# Patient Record
Sex: Male | Born: 1980 | Race: White | Hispanic: No | Marital: Single | Smoking: Current every day smoker
Health system: Southern US, Community
[De-identification: ages and names within clinical notes are randomized; demographics above are authoritative.]

## PROBLEM LIST (undated history)

## (undated) ENCOUNTER — Emergency Department (HOSPITAL_COMMUNITY): Disposition: A | Discharge status: Home / Self Care | Payer: Self-pay

## (undated) DIAGNOSIS — F1911 Other psychoactive substance abuse, in remission: Secondary | ICD-10-CM

## (undated) DIAGNOSIS — K219 Gastro-esophageal reflux disease without esophagitis: Secondary | ICD-10-CM

## (undated) DIAGNOSIS — S02609A Fracture of mandible, unspecified, initial encounter for closed fracture: Secondary | ICD-10-CM

## (undated) DIAGNOSIS — B1920 Unspecified viral hepatitis C without hepatic coma: Secondary | ICD-10-CM

## (undated) DIAGNOSIS — K08109 Complete loss of teeth, unspecified cause, unspecified class: Secondary | ICD-10-CM

## (undated) HISTORY — PX: HIP SURGERY: SHX245

---

## 2003-07-18 ENCOUNTER — Emergency Department (HOSPITAL_COMMUNITY): Admission: EM | Admit: 2003-07-18 | Discharge: 2003-07-19 | Payer: Self-pay | Admitting: Emergency Medicine

## 2008-12-15 ENCOUNTER — Emergency Department (HOSPITAL_BASED_OUTPATIENT_CLINIC_OR_DEPARTMENT_OTHER): Admission: EM | Admit: 2008-12-15 | Discharge: 2008-12-15 | Payer: Self-pay | Admitting: Emergency Medicine

## 2008-12-15 ENCOUNTER — Ambulatory Visit: Payer: Self-pay | Admitting: Diagnostic Radiology

## 2009-01-24 ENCOUNTER — Ambulatory Visit: Payer: Self-pay | Admitting: Diagnostic Radiology

## 2009-01-24 ENCOUNTER — Emergency Department (HOSPITAL_BASED_OUTPATIENT_CLINIC_OR_DEPARTMENT_OTHER): Admission: EM | Admit: 2009-01-24 | Discharge: 2009-01-24 | Payer: Self-pay | Admitting: Emergency Medicine

## 2009-05-02 ENCOUNTER — Emergency Department (HOSPITAL_BASED_OUTPATIENT_CLINIC_OR_DEPARTMENT_OTHER): Admission: EM | Admit: 2009-05-02 | Discharge: 2009-05-02 | Payer: Self-pay | Admitting: Emergency Medicine

## 2011-01-10 LAB — URINALYSIS, ROUTINE W REFLEX MICROSCOPIC
Bilirubin Urine: NEGATIVE
Glucose, UA: NEGATIVE mg/dL
Ketones, ur: NEGATIVE mg/dL
Protein, ur: NEGATIVE mg/dL
pH: 6.5 (ref 5.0–8.0)

## 2011-01-10 LAB — POCT TOXICOLOGY PANEL: Benzodiazepines: POSITIVE

## 2012-03-20 ENCOUNTER — Emergency Department (HOSPITAL_COMMUNITY)
Admission: EM | Admit: 2012-03-20 | Discharge: 2012-03-22 | Disposition: A | Discharge status: Other Acute Inpatient Hospital | Payer: Self-pay | Attending: Emergency Medicine | Admitting: Emergency Medicine

## 2012-03-20 ENCOUNTER — Encounter (HOSPITAL_COMMUNITY): Payer: Self-pay | Admitting: *Deleted

## 2012-03-20 DIAGNOSIS — B1920 Unspecified viral hepatitis C without hepatic coma: Secondary | ICD-10-CM | POA: Insufficient documentation

## 2012-03-20 DIAGNOSIS — F1910 Other psychoactive substance abuse, uncomplicated: Secondary | ICD-10-CM | POA: Insufficient documentation

## 2012-03-20 HISTORY — DX: Unspecified viral hepatitis C without hepatic coma: B19.20
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
## 2012-03-20 NOTE — ED Provider Notes (Signed)
History     CSN: [id number]  Arrival date & time 03/20/12  2049   First MD Initiated Contact with Patient 03/20/12 2301      Chief Complaint  Patient presents with  . Withdrawal    Pt wants to "get off drugs"    (Consider location/radiation/quality/duration/timing/severity/associated sxs/prior treatment) HPI Pt presenting with c/o wanting to stop using heroin and benzos.  He states he last used heroin tonight- also took 2 klonopin earlier [employer].  Pt denies feeeling suicidal or homicidal.  He states " i don't want to die, that's why I'm here".  Denies any recent illness, no fevers cough or vomiting.  Feels sleepy right now.  There are no other alleviating or modifying factors, there are no other associated systemic symptoms  Past Medical History  Diagnosis Date  . Hepatitis C     History reviewed. No pertinent past surgical history.  No family history on file.  History  Substance Use Topics  . Smoking status: Not on file  . Smokeless tobacco: Not on file  . Alcohol Use:       Review of Systems ROS reviewed and all otherwise negative except for mentioned in HPI  Allergies  Ivp dye  Home Medications  No current outpatient prescriptions on file.  BP 116/74  Pulse 67  Temp 96.7 F (35.9 C) (Oral)  Resp 10  SpO2 98% Vitals reviewed Physical Exam Physical Examination: General appearance - sleeping but easily arousable to voice, well appearing, and in no distress Mental status - alert, oriented to person, place, and time Eyes - pupils equal and reactive approx 1mm, no scleral icterus Mouth - mucous membranes moist, pharynx normal without lesions Chest - clear to auscultation, no wheezes, rales or rhonchi, symmetric air entry Heart - normal rate, regular rhythm, normal S1, S2, no murmurs, rubs, clicks or gallops Abdomen - soft, nontender, nondistended, no masses or organomegaly Extremities - peripheral pulses normal, no pedal edema, no clubbing or cyanosis Skin  - normal coloration and turgor, no rashes Psych- flat affect, cooperative  ED Course  Procedures (including critical care time)  12:33 AM calling to inquire about labs- state collected, but have been awaiting them for quite some time.   2:19 AM  D/w ACT team they will see patient for evaluation  Labs Reviewed  URINE RAPID DRUG SCREEN (HOSP PERFORMED) - Abnormal; Notable for the following:    Opiates POSITIVE (*)     Cocaine POSITIVE (*)     Benzodiazepines POSITIVE (*)     Tetrahydrocannabinol POSITIVE (*)     All other components within normal limits  COMPREHENSIVE METABOLIC PANEL - Abnormal; Notable for the following:    Glucose, Bld 128 (*)     All other components within normal limits  ETHANOL  CBC  DIFFERENTIAL   No results found.   1. Substance abuse       MDM  Patient presenting requesting detox from heroin. He also endorses them so use. His urine drug screen was positive in addition to these substances for cocaine and marijuana. He denies any suicidal ideations. Patient is currently awaiting disposition based on act team evaluation.  Psych holding orders written.        Ethelda Chick, MD 03/21/12 [phone]

## 2012-03-21 LAB — CBC
HCT: 41 % (ref 39.0–52.0)
Hemoglobin: 13.6 g/dL (ref 13.0–17.0)
RDW: 12.9 % (ref 11.5–15.5)
WBC: 7.3 10*3/uL (ref 4.0–10.5)

## 2012-03-21 LAB — ETHANOL: Alcohol, Ethyl (B): <11 mg/dL (ref 0–11)

## 2012-03-21 LAB — COMPREHENSIVE METABOLIC PANEL
Albumin: 3.6 g/dL (ref 3.5–5.2)
BUN: 13 mg/dL (ref 6–23)
Creatinine, Ser: 0.8 mg/dL (ref 0.50–1.35)
GFR calc Af Amer: >90 mL/min (ref >90)
Total Protein: 6.4 g/dL (ref 6.0–8.3)

## 2012-03-21 LAB — DIFFERENTIAL
Basophils Absolute: 0 10*3/uL (ref 0.0–0.1)
Lymphocytes Relative: 38 % (ref 12–46)
Monocytes Absolute: 0.7 10*3/uL (ref 0.1–1.0)
Neutro Abs: 3.6 10*3/uL (ref 1.7–7.7)
Neutrophils Relative %: 49 % (ref 43–77)

## 2012-03-21 LAB — RAPID URINE DRUG SCREEN, HOSP PERFORMED
Amphetamines: NOT DETECTED
Benzodiazepines: POSITIVE — AB
Cocaine: POSITIVE — AB
Opiates: POSITIVE — AB
Tetrahydrocannabinol: POSITIVE — AB

## 2012-03-21 MED ORDER — NICOTINE 21 MG/24HR TD PT24
21.0000 mg | MEDICATED_PATCH | Freq: Every day | TRANSDERMAL | Status: DC
  Administered 2012-03-22: 21 mg via TRANSDERMAL
  Filled 2012-03-21 (×2): qty 1

## 2012-03-21 MED ORDER — IBUPROFEN 400 MG PO TABS: 600.0000 mg | ORAL_TABLET | Freq: Three times a day (TID) | ORAL | Status: DC | PRN

## 2012-03-21 MED ORDER — ONDANSETRON HCL 8 MG PO TABS
4.0000 mg | ORAL_TABLET | Freq: Three times a day (TID) | ORAL | Status: DC | PRN
  Administered 2012-03-22: 4 mg via ORAL
  Filled 2012-03-21: qty 2
  Filled 2012-03-21: qty 1

## 2012-03-21 MED ORDER — ACETAMINOPHEN 325 MG PO TABS
650.0000 mg | ORAL_TABLET | ORAL | Status: DC | PRN
  Administered 2012-03-21: 650 mg via ORAL
  Filled 2012-03-21: qty 2

## 2012-03-21 NOTE — ED Notes (Signed)
Marchelle Folks, ACT Team Counselor present at patient's bedside.
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
## 2012-03-21 NOTE — ED Notes (Signed)
Patient awaken and states his back hurts and he wants to sleep. Lunch tray given to patient.

## 2012-03-21 NOTE — ED Notes (Signed)
Pt awake at this time c/o lower back pain and requesting Tylenol.  Dr. Weldon Inches made aware.

## 2012-03-21 NOTE — BH Assessment (Signed)
BHH Assessment Progress Note      Attempted to assess patient who continues to request detox, but was still "out of it".  Pt would attempt to answer questions, but fall asleep after every question and require constant stimulation to wake up.  Pt finally stated, "I'm really out of it."  I explained he would have to be reassessed when he could participate in the assessment.  He was concerned that he would be kicked out, but agreed that he could not participate at this time.  RN notified to notify ACT team when patient is alert and cooperative.

## 2012-03-21 NOTE — ED Notes (Signed)
Pt resting with eyes closed.  Pt opened eyes when I entered the room then closed them back again.  Lunch tray removed from room pt did not eat anything from tray.  Dinner tray ordered for pt.

## 2012-03-21 NOTE — BH Assessment (Signed)
BHH Assessment Progress Note      Clinician attempted to assess patient for placement in inpatient detox.  Pt woke up and looked at clinician, but immediately closed his eyes and laid back down.  Clinician attempted to wake patient 3 times, but was unable to wake him enough to complete the assessment.  Notified RN.  Will continue to follow.

## 2012-03-21 NOTE — ED Notes (Addendum)
First meeting with patient. Patient sleeping and remains on monitor and sats of 98% RA.

## 2012-03-21 NOTE — ED Notes (Signed)
Pt ate part of his dinner and then went back to sleep.  Pt c/o back pain, bed repositioned.

## 2012-03-22 MED ORDER — LORAZEPAM 1 MG PO TABS
2.0000 mg | ORAL_TABLET | Freq: Once | ORAL | Status: AC
  Administered 2012-03-22: 2 mg via ORAL
  Filled 2012-03-22: qty 2

## 2012-03-22 NOTE — ED Notes (Signed)
Lunch tray ordered 

## 2012-03-22 NOTE — ED Notes (Signed)
Pt resting no needs at this time.

## 2012-03-22 NOTE — ED Notes (Signed)
Lunch tray arrived.   

## 2012-03-22 NOTE — Discharge Instructions (Signed)
Go to [facility] where they expect you.

## 2012-03-22 NOTE — ED Notes (Signed)
Pt resting at this time.

## 2012-03-22 NOTE — ED Notes (Signed)
Pt resting, no needs at this time.

## 2012-03-22 NOTE — ED Notes (Signed)
Patient experiencing nausea with dry heaves.

## 2012-03-22 NOTE — BH Assessment (Signed)
Assessment Note   Caleb Bush is an 31 y.o. male. Pt presents in the Ed due to heavy SA use.  Pt. Drug of choice is heroin. Pt. Reports using at least 3-4 grams per day.  Pt. Also reports he uses opiates, pt reports he "takes as many as he can".  Pt. Was incarcerated for 2 1/2 years and was released on or about March of this year.  Pt. Reports he maintained his sobriety while in prison and upon release began using immediately.  Pt. Reports he is "tired of using and wants his family to love him".  Pt. Reports he was staying with mother, who is also in recovery, drug of choice unknown, but she put him out due to his reckless behaviors and drug use.  Pt. Has pending criminal charges with court dates on July 29 and Aug, 19 for DUI.  Pt. Was tearful, agitated and having hot/cold flashes during interview.  Pt. Has been in ED since June 21 and was unable to assess because staff could not wake him.  Pt. Reports "he is very tired and just want to sleep".  Pt. Is seeking detox and residential treatment and reports "he just want to be clean and not use drugs anymore". Pt. Has insight into his drug addiction and seems to want sobriety.  Pt. Has no SI, no HI, no A/V hallucinations and no hx of mental health or substance abuse treatment.  Pt. Referred to [facility] for detox.  Pt. Will need transportation as he denies having a support system.    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 Hepatitis C     History reviewed. No pertinent past surgical history.  Family History: No family history on file.  Social History:  does not have a smoking history on file. He does not have any smokeless tobacco history on file. His alcohol and drug histories not on file.  Additional Social History:  Alcohol / Drug Use History of  alcohol / drug use?: Yes Substance #1 Name of Substance 1: Heroin 1 - Age of First Use: 23 1 - Amount (size/oz): 3 grams 1 - Frequency: daily 1 - Duration: 2years 1 - Last Use / Amount: 03/20/12  3 grams Substance #2 Name of Substance 2: Xanax 2 - Frequency: daily  CIWA: CIWA-Ar BP: 124/78 mmHg Pulse Rate: 89  COWS:    Allergies:  Allergies  Allergen Reactions  . Ivp Dye (Iodinated Diagnostic Agents)     Unknown    Home Medications:  (Not in a hospital admission)  OB/GYN Status:  No LMP for male patient.  General Assessment Data Location of Assessment: [facility] ED ACT Assessment: Yes Living Arrangements: Other (Comment) Can pt return to current living arrangement?: No Admission Status: Voluntary Is patient capable of signing voluntary admission?: Yes Transfer from: Home Referral Source: MD  Education Status Is patient currently in school?: No  Risk to self Suicidal Ideation: No Suicidal Intent: No Is patient at risk for suicide?: No Suicidal Plan?: No Access to Means: No Specify Access to Suicidal Means: denies What has been your use of drugs/alcohol within the last 12 months?: daily Previous Attempts/Gestures: No How many times?: 0  Other Self Harm Risks: drug use, reckless driving Triggers for Past Attempts: Unknown Intentional Self Injurious Behavior: Damaging Comment -  Self Injurious Behavior: ETOH driving, drug use Family Suicide History: No Recent stressful life event(s): Conflict (Comment) Persecutory voices/beliefs?: No Depression: Yes Depression Symptoms: Despondent;Tearfulness;Fatigue;Loss of interest in usual pleasures;Feeling worthless/self pity;Guilt Substance abuse history and/or treatment for substance abuse?: Yes Suicide prevention information given to non-admitted patients: Not applicable  Risk to Others Homicidal Ideation: No Thoughts of Harm to Others: No Current Homicidal Intent: No Current Homicidal Plan: No Access to Homicidal Means:  No Identified Victim: denies History of harm to others?: No Assessment of Violence: None Noted Violent Behavior Description: denies Does patient have access to weapons?: No Criminal Charges Pending?: Yes Describe Pending Criminal Charges: DUI, reckless driving Does patient have a court date: Yes Court Date: 04/27/12  Psychosis Hallucinations: None noted Delusions: None noted  Mental Status Report Appear/Hygiene: Disheveled Eye Contact: Fair Motor Activity: Freedom of movement Speech: Logical/coherent Level of Consciousness: Alert Mood: Depressed Affect: Appropriate to circumstance Anxiety Level: Minimal Thought Processes: Coherent;Relevant Judgement: Unimpaired Orientation: Person;Place;Situation Obsessive Compulsive Thoughts/Behaviors: None  Cognitive Functioning Concentration: Normal Memory: Recent Intact;Remote Intact IQ: Average Insight: Poor Impulse Control: Poor Appetite: Poor Weight Loss: 0  Weight Gain: 0  Sleep: Decreased Total Hours of Sleep: 1  Vegetative Symptoms: Decreased grooming  ADLScreening [facility]) Patient's cognitive ability adequate to safely complete daily activities?: Yes Patient able to express need for assistance with ADLs?: Yes Independently performs ADLs?: Yes  Abuse/Neglect [facility]) Physical Abuse: Denies Verbal Abuse: Denies Sexual Abuse: Denies  Prior Inpatient Therapy Prior Inpatient Therapy: No  Prior Outpatient Therapy Prior Outpatient Therapy: No  ADL Screening (condition at time of admission) Patient's cognitive ability adequate to safely complete daily activities?: Yes Patient able to express need for assistance with ADLs?: Yes Independently performs ADLs?: Yes       Abuse/Neglect Assessment (Assessment to be complete while patient is alone) Physical Abuse: Denies Verbal Abuse: Denies Sexual Abuse: Denies Values / Beliefs Cultural Requests During Hospitalization: None Spiritual Requests During  Hospitalization: None              Disposition: Pt. Referred to [facility], pending disposition.  Spoke to [place] she will review pt. For admission and contact ACT if any additional information is needed.  Disposition Disposition of Patient: Inpatient treatment program;Referred to Type of inpatient treatment program: Adult Patient referred to: [facility]  On Site Evaluation by:   Reviewed with Physician:     Barbaraann Boys 03/22/2012 1:20 PM

## 2012-03-22 NOTE — ED Notes (Signed)
Pt placed back on monitor, continuous pulse oximetry and blood pressure cuff; pt now resting and has no needs at this time

## 2012-03-22 NOTE — ED Notes (Signed)
Pt asleep no needs at this time

## 2012-03-22 NOTE — ED Notes (Signed)
Pt in room sitting on the end of stretcher talking on phone

## 2012-03-22 NOTE — ED Notes (Signed)
Report given to [place], NT

## 2012-03-22 NOTE — ED Provider Notes (Signed)
Pt has been accepted at [facility], MD 03/22/12 1945

## 2012-03-22 NOTE — ED Notes (Signed)
Pt inquiring about his status; informed RN and made pt aware that ACT Team was coming to speak with him about detox; pt acknowledged and understood; pt resting now

## 2012-03-22 NOTE — BHH Counselor (Signed)
Counselor returned phone call to [facility] spoke with Baird Lyons to complete screening; provided information in reference to drug history, insurance, military history, withdrawal and allergies; and transportation.  Baird Lyons with [facility] stated that she would pass the information onto the scheduling nurse and states that they would call ACT back with more information.  Spoke with pt would stated that he was experiencing extreme agitation and that if he could not get placed he would be leaving; cm explained to pt that ACT was attempting to get him placed as soon as possible and that cm would speak with his nurse in regards to his agitation; pt states that if he does not get placed today that he would be leaving because his mother has found a [place] based 90 day recovery program that he can enter into on tomorrow 03/23/12; Cm again explained to pt that ACT was working hard to get him placed and that I had just gotten off the phone with [facility] in regards to his placement; cm explained to pt that she would keep him updated.Marland Kitchen

## 2012-03-22 NOTE — ED Notes (Signed)
Pt sleeping w/ tv on and lights off.

## 2013-04-16 ENCOUNTER — Emergency Department (HOSPITAL_BASED_OUTPATIENT_CLINIC_OR_DEPARTMENT_OTHER)
Admission: EM | Admit: 2013-04-16 | Discharge: 2013-04-16 | Disposition: A | Discharge status: Home / Self Care | Payer: Self-pay | Attending: Emergency Medicine | Admitting: Emergency Medicine

## 2013-04-16 ENCOUNTER — Encounter (HOSPITAL_BASED_OUTPATIENT_CLINIC_OR_DEPARTMENT_OTHER): Payer: Self-pay

## 2013-04-16 DIAGNOSIS — R109 Unspecified abdominal pain: Secondary | ICD-10-CM | POA: Insufficient documentation

## 2013-04-16 DIAGNOSIS — R6883 Chills (without fever): Secondary | ICD-10-CM | POA: Insufficient documentation

## 2013-04-16 DIAGNOSIS — Z8619 Personal history of other infectious and parasitic diseases: Secondary | ICD-10-CM | POA: Insufficient documentation

## 2013-04-16 DIAGNOSIS — F411 Generalized anxiety disorder: Secondary | ICD-10-CM | POA: Insufficient documentation

## 2013-04-16 DIAGNOSIS — F1110 Opioid abuse, uncomplicated: Secondary | ICD-10-CM | POA: Insufficient documentation

## 2013-04-16 DIAGNOSIS — R110 Nausea: Secondary | ICD-10-CM | POA: Insufficient documentation

## 2013-04-16 DIAGNOSIS — F19939 Other psychoactive substance use, unspecified with withdrawal, unspecified: Secondary | ICD-10-CM | POA: Insufficient documentation

## 2013-04-16 DIAGNOSIS — F1123 Opioid dependence with withdrawal: Secondary | ICD-10-CM

## 2013-04-16 MED ORDER — CLONIDINE HCL 0.2 MG PO TABS: 0.2000 mg | ORAL_TABLET | Freq: Two times a day (BID) | ORAL | Status: DC

## 2013-04-16 NOTE — ED Notes (Signed)
MD at bedside. 

## 2013-04-16 NOTE — ED Provider Notes (Signed)
   History    CSN: [id number] Arrival date & time 04/16/13  1020  None    Chief Complaint  Patient presents with  . Hypertension  . Abdominal Pain   (Consider location/radiation/quality/duration/timing/severity/associated sxs/prior Treatment) HPI Comments: Patient presents to the ER for evaluation of elevated blood pressure. Patient currently inpatient for heroin abuse. Has not used in 2 days. Today's blood pressure was elevated, 143/101. Patient reports mild nausea, no vomiting or diarrhea. He has had some chills and cramping of the abdomen. No headache, blurred vision, chest pain.  Patient is a 32 y.o. male presenting with hypertension and abdominal pain.  Hypertension Associated symptoms include abdominal pain.  Abdominal Pain Associated symptoms include abdominal pain.   Past Medical History  Diagnosis Date  . Hepatitis C    No past surgical history on file. No family history on file. History  Substance Use Topics  . Smoking status: Not on file  . Smokeless tobacco: Not on file  . Alcohol Use:     Review of Systems  Constitutional: Positive for chills.  Gastrointestinal: Positive for nausea and abdominal pain.  Psychiatric/Behavioral: The patient is nervous/anxious.   All other systems reviewed and are negative.    Allergies  Ivp dye  Home Medications  No current outpatient prescriptions on file. There were no vitals taken for this visit. Physical Exam  Constitutional: He is oriented to person, place, and time. He appears well-developed and well-nourished. No distress.  HENT:  Head: Normocephalic and atraumatic.  Right Ear: Hearing normal.  Left Ear: Hearing normal.  Nose: Nose normal.  Mouth/Throat: Oropharynx is clear and moist and mucous membranes are normal.  Eyes: Conjunctivae and EOM are normal. Pupils are equal, round, and reactive to light.  Neck: Normal range of motion. Neck supple.  Cardiovascular: Regular rhythm, S1 normal and S2 normal.  Exam  reveals no gallop and no friction rub.   No murmur heard. Pulmonary/Chest: Effort normal and breath sounds normal. No respiratory distress. He exhibits no tenderness.  Abdominal: Soft. Normal appearance and bowel sounds are normal. There is no hepatosplenomegaly. There is no tenderness. There is no rebound, no guarding, no tenderness at McBurney's point and negative Murphy's sign. No hernia.  Musculoskeletal: Normal range of motion.  Neurological: He is alert and oriented to person, place, and time. He has normal strength. No cranial nerve deficit or sensory deficit. Coordination normal. GCS eye subscore is 4. GCS verbal subscore is 5. GCS motor subscore is 6.  Skin: Skin is warm, dry and intact. No rash noted. No cyanosis.  Psychiatric: He has a normal mood and affect. His speech is normal and behavior is normal. Thought content normal.    ED Course  Procedures (including critical care time) Labs Reviewed - No data to display No results found.  Diagnosis: Heroin withdrawal; hypertension  MDM  Patient presents to the ER for evaluation of mild blood pressure elevation. He reports that in the past he has had mild elevation of blood pressures, but never been treated. Patient is currently day 3 after taking it for heroin addiction. Patient's symptoms are all consistent with mild withdrawal. Patient to be treated with clonidine for symptomatic treatment of withdrawal as well as his blood pressure.  Gilda Crease, MD 04/16/13 1038

## 2013-04-16 NOTE — ED Notes (Signed)
Sent to ED for further evaluation of elevated BP.  Pt resides at [facility] and states BP was 143/101 PTA.  He also reports not feeling well, abdominal pain and chills.

## 2013-04-24 ENCOUNTER — Emergency Department (HOSPITAL_BASED_OUTPATIENT_CLINIC_OR_DEPARTMENT_OTHER)
Admission: EM | Admit: 2013-04-24 | Discharge: 2013-04-24 | Disposition: A | Discharge status: Home / Self Care | Payer: Self-pay | Attending: Emergency Medicine | Admitting: Emergency Medicine

## 2013-04-24 ENCOUNTER — Encounter (HOSPITAL_BASED_OUTPATIENT_CLINIC_OR_DEPARTMENT_OTHER): Payer: Self-pay | Admitting: Emergency Medicine

## 2013-04-24 DIAGNOSIS — R21 Rash and other nonspecific skin eruption: Secondary | ICD-10-CM | POA: Insufficient documentation

## 2013-04-24 DIAGNOSIS — F17200 Nicotine dependence, unspecified, uncomplicated: Secondary | ICD-10-CM | POA: Insufficient documentation

## 2013-04-24 DIAGNOSIS — Z8619 Personal history of other infectious and parasitic diseases: Secondary | ICD-10-CM | POA: Insufficient documentation

## 2013-04-24 NOTE — ED Notes (Signed)
Pt sts at [facility] and noticed rash to chest, thinks it may be from detergent. Pt sts they made hime come get checked out.

## 2013-04-24 NOTE — ED Provider Notes (Signed)
  CSN: [id number]     Arrival date & time 04/24/13  1919 History     First MD Initiated Contact with Patient 04/24/13 1958     Chief Complaint  Patient presents with  . Rash   (Consider location/radiation/quality/duration/timing/severity/associated sxs/prior Treatment) HPI  32 year old male presents for evaluation of a rash.  Sts he is currently at [facility], pt notice a small rash to his chest and when he asked the staff they recommend pt to come to ER for evaluation.  Pt sts he wash his clothes with lye 5 days ago and wear his clothes yesterday when he took off his shirt he noticed the rash.  Sts rash is non itchy, non tender.  No c/o fever, chills, cp, sob, throat swelling.  No other rash, no one else with simlar rash.  No specific treatment tried.    Past Medical History  Diagnosis Date  . Hepatitis C   . Substance abuse    No past surgical history on file. No family history on file. History  Substance Use Topics  . Smoking status: Current Every Day Smoker -- 0.50 packs/day    Types: Cigarettes  . Smokeless tobacco: Not on file  . Alcohol Use: No    Review of Systems  Constitutional: Negative for fever.  Respiratory: Negative for shortness of breath.   Skin: Positive for rash.  Neurological: Negative for headaches.    Allergies  Ivp dye  Home Medications   Current Outpatient Rx  Name  Route  Sig  Dispense  Refill  . cloNIDine (CATAPRES) 0.2 MG tablet   Oral   Take 1 tablet (0.2 mg total) by mouth 2 (two) times daily. 1 tab po tid x 2 days, then bid x 2 days, then once daily x 2 days   10 tablet   0    BP 162/98  Pulse 98  Temp(Src) 98.3 F (36.8 C) (Oral)  Resp 18  Ht 5\' 11"  (1.803 m)  Wt 155 lb (70.308 kg)  BMI 21.63 kg/m2  SpO2 100% Physical Exam  Nursing note and vitals reviewed. Constitutional: He appears well-developed and well-nourished. No distress.  HENT:  Head: Atraumatic.  Eyes: Conjunctivae are normal.  Neck: Neck supple.  Cardiovascular:  Normal rate and regular rhythm.   Pulmonary/Chest: Effort normal and breath sounds normal. No respiratory distress. He has no wheezes.  Abdominal: Soft.  Neurological: He is alert.  Skin: Skin is warm. Rash (pt with 3 small 2mm maculopapular rash noted to L anterior chest, blanchable, non pustular/vesicular/petechial lesions.  nontender) noted.  Psychiatric: He has a normal mood and affect.    ED Course   Procedures (including critical care time)  Pt with 3 small skin lesions resembling local skin irritation.  No red flags.  Reassurance given.  Doubt scabies, bed bugs, or pathologic rash.  No specific treatment necessary.   Labs Reviewed - No data to display No results found. 1. Rash     MDM  BP 162/98  Pulse 98  Temp(Src) 98.3 F (36.8 C) (Oral)  Resp 18  Ht 5\' 11"  (1.803 m)  Wt 155 lb (70.308 kg)  BMI 21.63 kg/m2  SpO2 100%   Fayrene Helper, PA-C 04/24/13 2009  Fayrene Helper, PA-C 04/24/13 2011

## 2013-04-24 NOTE — ED Provider Notes (Signed)
Medical screening examination/treatment/procedure(s) were performed by non-physician practitioner and as supervising physician I was immediately available for consultation/collaboration.   Raif Chachere, MD 04/24/13 2345 

## 2014-04-05 ENCOUNTER — Inpatient Hospital Stay (HOSPITAL_COMMUNITY)
Admission: EM | Admit: 2014-04-05 | Discharge: 2014-04-08 | DRG: 917 | Disposition: A | Discharge status: Other Acute Inpatient Hospital | Payer: Self-pay | Attending: Internal Medicine | Admitting: Internal Medicine

## 2014-04-05 ENCOUNTER — Encounter (HOSPITAL_COMMUNITY): Payer: Self-pay | Admitting: Emergency Medicine

## 2014-04-05 DIAGNOSIS — T401X1A Poisoning by heroin, accidental (unintentional), initial encounter: Secondary | ICD-10-CM

## 2014-04-05 DIAGNOSIS — R4182 Altered mental status, unspecified: Secondary | ICD-10-CM | POA: Diagnosis present

## 2014-04-05 DIAGNOSIS — J9600 Acute respiratory failure, unspecified whether with hypoxia or hypercapnia: Secondary | ICD-10-CM | POA: Diagnosis present

## 2014-04-05 DIAGNOSIS — T401X4A Poisoning by heroin, undetermined, initial encounter: Principal | ICD-10-CM

## 2014-04-05 DIAGNOSIS — J9601 Acute respiratory failure with hypoxia: Secondary | ICD-10-CM

## 2014-04-05 DIAGNOSIS — T401X1S Poisoning by heroin, accidental (unintentional), sequela: Secondary | ICD-10-CM

## 2014-04-05 DIAGNOSIS — F1110 Opioid abuse, uncomplicated: Secondary | ICD-10-CM

## 2014-04-05 DIAGNOSIS — F1910 Other psychoactive substance abuse, uncomplicated: Secondary | ICD-10-CM

## 2014-04-05 DIAGNOSIS — K739 Chronic hepatitis, unspecified: Secondary | ICD-10-CM

## 2014-04-05 DIAGNOSIS — B182 Chronic viral hepatitis C: Secondary | ICD-10-CM | POA: Diagnosis present

## 2014-04-05 DIAGNOSIS — G4700 Insomnia, unspecified: Secondary | ICD-10-CM | POA: Diagnosis present

## 2014-04-05 DIAGNOSIS — F1920 Other psychoactive substance dependence, uncomplicated: Secondary | ICD-10-CM | POA: Diagnosis present

## 2014-04-05 DIAGNOSIS — R400 Somnolence: Secondary | ICD-10-CM

## 2014-04-05 DIAGNOSIS — F17200 Nicotine dependence, unspecified, uncomplicated: Secondary | ICD-10-CM | POA: Diagnosis present

## 2014-04-05 LAB — ETHANOL

## 2014-04-05 LAB — COMPREHENSIVE METABOLIC PANEL
ALBUMIN: 4.1 g/dL (ref 3.5–5.2)
ALT: 23 U/L (ref 0–53)
AST: 31 U/L (ref 0–37)
Alkaline Phosphatase: 94 U/L (ref 39–117)
Anion gap: 12 (ref 5–15)
BUN: 11 mg/dL (ref 6–23)
CALCIUM: 9.5 mg/dL (ref 8.4–10.5)
CO2: 27 meq/L (ref 19–32)
CREATININE: 0.92 mg/dL (ref 0.50–1.35)
Chloride: 101 mEq/L (ref 96–112)
GFR calc Af Amer: >90 mL/min (ref >90)
Glucose, Bld: 100 mg/dL — ABNORMAL HIGH (ref 70–99)
Potassium: 4.7 mEq/L (ref 3.7–5.3)
SODIUM: 140 meq/L (ref 137–147)
TOTAL PROTEIN: 7.9 g/dL (ref 6.0–8.3)
Total Bilirubin: 0.4 mg/dL (ref 0.3–1.2)

## 2014-04-05 LAB — CBC
HCT: 43.7 % (ref 39.0–52.0)
Hemoglobin: 14.4 g/dL (ref 13.0–17.0)
MCH: 29.8 pg (ref 26.0–34.0)
MCHC: 33 g/dL (ref 30.0–36.0)
MCV: 90.5 fL (ref 78.0–100.0)
PLATELETS: 232 10*3/uL (ref 150–400)
RBC: 4.83 MIL/uL (ref 4.22–5.81)
RDW: 13.1 % (ref 11.5–15.5)
WBC: 8.7 10*3/uL (ref 4.0–10.5)

## 2014-04-05 LAB — ACETAMINOPHEN LEVEL: Acetaminophen (Tylenol), Serum: <15 ug/mL (ref 10–30)

## 2014-04-05 LAB — RAPID URINE DRUG SCREEN, HOSP PERFORMED
Amphetamines: NOT DETECTED
BENZODIAZEPINES: POSITIVE — AB
Barbiturates: NOT DETECTED
COCAINE: POSITIVE — AB
OPIATES: POSITIVE — AB
Tetrahydrocannabinol: POSITIVE — AB

## 2014-04-05 LAB — SALICYLATE LEVEL

## 2014-04-05 NOTE — ED Notes (Signed)
Pt presents to department for evaluation of heroin overdose. Mother states last use [id number] ago at home. Upon arrival pt lethargic and difficult to arouse. History of substance abuse and hepatitis c.

## 2014-04-05 NOTE — ED Notes (Signed)
Pt remains extremely lethargic. Unable to stay awake to answer orientation questions.

## 2014-04-05 NOTE — ED Provider Notes (Signed)
Seen on arrival. Patient reportedly used heroin 30 minutes prior to arrival. Patient arrives somnolent. Arousable to verbal stimulus.: 40 5 PM patient remains somnolent, arousable to loud verbal stimulus. Speech slurred.. Moves all extremities  Doug SouSam Gabrella Stroh, MD 04/05/14 2352

## 2014-04-05 NOTE — ED Notes (Signed)
Pt very lethargic. Difficult to arouse. Unable to stay awake to answer orientation questions.

## 2014-04-05 NOTE — ED Notes (Signed)
Mother, Caleb Bush [phone]

## 2014-04-05 NOTE — ED Notes (Signed)
pts mother at bedside she states she [facility] because "he(the pt) wants detox for heroin" and was tolod he  [facility] to [facility] states he uses approx 2 grams heroin daily abd  Has since 33 yo last used 30 min pta

## 2014-04-05 NOTE — ED Provider Notes (Addendum)
CSN: [id number]     Arrival date & time 04/05/14  1823 History   First MD Initiated Contact with Patient 04/05/14 1852     Chief Complaint  Patient presents with  . Drug Overdose     (Consider location/radiation/quality/duration/timing/severity/associated sxs/prior Treatment) Patient is a 33 y.o. male presenting with drug/alcohol assessment.  Drug / Alcohol Assessment Similar prior episodes: yes   Severity:  Severe Onset quality:  Sudden Timing:  Constant Chronicity:  New Suspected agents:  Heroin and prescription drugs Associated symptoms: no abdominal pain, no bladder incontinence, no bowel incontinence, no nausea and no weakness   Risk factors: addiction treatment     Past Medical History  Diagnosis Date  . Hepatitis C   . Substance abuse    History reviewed. No pertinent past surgical history. History reviewed. No pertinent family history. History  Substance Use Topics  . Smoking status: Current Every Day Smoker -- 0.50 packs/day    Types: Cigarettes  . Smokeless tobacco: Not on file  . Alcohol Use: No    Review of Systems  Unable to perform ROS: Acuity of condition  Gastrointestinal: Negative for nausea, abdominal pain and bowel incontinence.  Genitourinary: Negative for bladder incontinence.  Neurological: Negative for weakness.      Allergies  Ivp dye and Penicillins  Home Medications   Prior to Admission medications   Not on File   BP 110/66  Pulse 83  Temp(Src) 98.7 F (37.1 C) (Oral)  Resp 14  SpO2 100% Physical Exam  Constitutional: He appears well-developed and well-nourished. He appears lethargic. No distress.  HENT:  Head: Normocephalic and atraumatic.  Eyes: Pupils are equal, round, and reactive to light.  Neck: Normal range of motion.  Cardiovascular: Normal rate and regular rhythm.   Pulmonary/Chest: Effort normal and breath sounds normal.  Abdominal: Soft. He exhibits no distension. There is no tenderness.  Musculoskeletal: Normal  range of motion.  Neurological: He appears lethargic. GCS eye subscore is 3. GCS verbal subscore is 5. GCS motor subscore is 6.  Skin: Skin is warm. He is not diaphoretic.    ED Course  Procedures (including critical care time) Labs Review Labs Reviewed  COMPREHENSIVE METABOLIC PANEL - Abnormal; Notable for the following:    Glucose, Bld 100 (*)    All other components within normal limits  SALICYLATE LEVEL - Abnormal; Notable for the following:    Salicylate Lvl <2.0 (*)    All other components within normal limits  URINE RAPID DRUG SCREEN (HOSP PERFORMED) - Abnormal; Notable for the following:    Opiates POSITIVE (*)    Cocaine POSITIVE (*)    Benzodiazepines POSITIVE (*)    Tetrahydrocannabinol POSITIVE (*)    All other components within normal limits  CBC  ETHANOL  ACETAMINOPHEN LEVEL  ACETAMINOPHEN LEVEL    Imaging Review No results found.   EKG Interpretation   Date/Time:  Tuesday April 05 2014 18:41:08 EDT Ventricular Rate:  77 PR Interval:  154 QRS Duration: 106 QT Interval:  383 QTC Calculation: 433 R Axis:   64 Text Interpretation:  Sinus rhythm RSR' in V1 or V2, right VCD or RVH ST  elev, probable normal early repol pattern No old tracing to compare  Confirmed by Ethelda ChickJACUBOWITZ  MD, SAM [phone]([id number]) on 04/06/2014 12:25:13 AM      MDM   Final diagnoses:  Heroin overdose, undetermined intent, initial encounter   33 yo M with hx of polysubstance abuse [facility] Patient used heroin  approx 30 min PTA.   Patient ABCs intact. Patient GCS 14 (will take up to stimulation). Patient presentation c/w narcotic abuse. Will observe patient <4 hours to check for improvement. If still intoxicated, will consider admission.   9:32 PM Patient reassessed, GCS still 14 without improvement.   After 4 hours of observation the patient still has a GCS of 14. He is arousable to stimulation. Because of this the patient cannot be medically cleared and is  unable to go to rehabilitation at this time. I consult with the hospitalist on call who agrees to admit the patient to the step down unit. Anticipate admission to the step down unit without complications. Patient seen and evaluated by myself and by the attending Dr. Ethelda ChickJacubowitz.     Imagene ShellerSteve Sky Borboa, MD 04/06/14 Moses [id number]

## 2014-04-06 ENCOUNTER — Encounter (HOSPITAL_COMMUNITY): Payer: Self-pay | Admitting: *Deleted

## 2014-04-06 DIAGNOSIS — T401X4A Poisoning by heroin, undetermined, initial encounter: Principal | ICD-10-CM

## 2014-04-06 DIAGNOSIS — F1910 Other psychoactive substance abuse, uncomplicated: Secondary | ICD-10-CM

## 2014-04-06 DIAGNOSIS — F1110 Opioid abuse, uncomplicated: Secondary | ICD-10-CM | POA: Diagnosis present

## 2014-04-06 DIAGNOSIS — T401X1A Poisoning by heroin, accidental (unintentional), initial encounter: Secondary | ICD-10-CM

## 2014-04-06 DIAGNOSIS — J9601 Acute respiratory failure with hypoxia: Secondary | ICD-10-CM | POA: Diagnosis present

## 2014-04-06 DIAGNOSIS — R404 Transient alteration of awareness: Secondary | ICD-10-CM

## 2014-04-06 DIAGNOSIS — T50901S Poisoning by unspecified drugs, medicaments and biological substances, accidental (unintentional), sequela: Secondary | ICD-10-CM

## 2014-04-06 DIAGNOSIS — K739 Chronic hepatitis, unspecified: Secondary | ICD-10-CM | POA: Diagnosis present

## 2014-04-06 DIAGNOSIS — R4182 Altered mental status, unspecified: Secondary | ICD-10-CM | POA: Diagnosis present

## 2014-04-06 DIAGNOSIS — T6591XS Toxic effect of unspecified substance, accidental (unintentional), sequela: Secondary | ICD-10-CM

## 2014-04-06 LAB — CBC
HCT: 40.5 % (ref 39.0–52.0)
Hemoglobin: 13.2 g/dL (ref 13.0–17.0)
MCH: 29.3 pg (ref 26.0–34.0)
MCHC: 32.6 g/dL (ref 30.0–36.0)
MCV: 89.8 fL (ref 78.0–100.0)
PLATELETS: 203 10*3/uL (ref 150–400)
RBC: 4.51 MIL/uL (ref 4.22–5.81)
RDW: 13.3 % (ref 11.5–15.5)
WBC: 6.9 10*3/uL (ref 4.0–10.5)

## 2014-04-06 LAB — BASIC METABOLIC PANEL
Anion gap: 13 (ref 5–15)
BUN: 9 mg/dL (ref 6–23)
CO2: 24 mEq/L (ref 19–32)
Calcium: 8.8 mg/dL (ref 8.4–10.5)
Chloride: 104 mEq/L (ref 96–112)
Creatinine, Ser: 0.86 mg/dL (ref 0.50–1.35)
Glucose, Bld: 97 mg/dL (ref 70–99)
Potassium: 4 mEq/L (ref 3.7–5.3)
Sodium: 141 mEq/L (ref 137–147)

## 2014-04-06 LAB — MRSA PCR SCREENING: MRSA by PCR: NEGATIVE

## 2014-04-06 MED ORDER — CLONIDINE HCL 0.1 MG PO TABS
0.1000 mg | ORAL_TABLET | Freq: Four times a day (QID) | ORAL | Status: DC
  Administered 2014-04-06 – 2014-04-07 (×3): 0.1 mg via ORAL
  Filled 2014-04-06 (×6): qty 1

## 2014-04-06 MED ORDER — ONDANSETRON HCL 4 MG PO TABS: 4.0000 mg | ORAL_TABLET | Freq: Four times a day (QID) | ORAL | Status: DC | PRN

## 2014-04-06 MED ORDER — ACETAMINOPHEN 325 MG PO TABS: 650.0000 mg | ORAL_TABLET | Freq: Four times a day (QID) | ORAL | Status: DC | PRN

## 2014-04-06 MED ORDER — METHOCARBAMOL 500 MG PO TABS
500.0000 mg | ORAL_TABLET | Freq: Three times a day (TID) | ORAL | Status: DC | PRN
  Filled 2014-04-06: qty 1

## 2014-04-06 MED ORDER — ALUM & MAG HYDROXIDE-SIMETH 200-200-20 MG/5ML PO SUSP: 30.0000 mL | Freq: Four times a day (QID) | ORAL | Status: DC | PRN

## 2014-04-06 MED ORDER — ADULT MULTIVITAMIN W/MINERALS CH
1.0000 | ORAL_TABLET | Freq: Every day | ORAL | Status: DC
  Administered 2014-04-06 – 2014-04-08 (×3): 1 via ORAL
  Filled 2014-04-06 (×3): qty 1

## 2014-04-06 MED ORDER — CHLORDIAZEPOXIDE HCL 25 MG PO CAPS: 25.0000 mg | ORAL_CAPSULE | ORAL | Status: DC

## 2014-04-06 MED ORDER — OXYCODONE HCL 5 MG PO TABS: 5.0000 mg | ORAL_TABLET | ORAL | Status: DC | PRN

## 2014-04-06 MED ORDER — HYDROMORPHONE HCL PF 1 MG/ML IJ SOLN: 0.5000 mg | INTRAMUSCULAR | Status: DC | PRN

## 2014-04-06 MED ORDER — ONDANSETRON HCL 4 MG/2ML IJ SOLN
4.0000 mg | Freq: Four times a day (QID) | INTRAMUSCULAR | Status: DC | PRN
  Filled 2014-04-06: qty 2

## 2014-04-06 MED ORDER — PANTOPRAZOLE SODIUM 40 MG IV SOLR
40.0000 mg | INTRAVENOUS | Status: DC
  Administered 2014-04-06 (×2): 40 mg via INTRAVENOUS
  Filled 2014-04-06 (×4): qty 40

## 2014-04-06 MED ORDER — SODIUM CHLORIDE 0.9 % IV SOLN: INTRAVENOUS | Status: DC

## 2014-04-06 MED ORDER — ONDANSETRON 4 MG PO TBDP
4.0000 mg | ORAL_TABLET | Freq: Four times a day (QID) | ORAL | Status: DC | PRN
  Filled 2014-04-06: qty 1

## 2014-04-06 MED ORDER — CHLORDIAZEPOXIDE HCL 25 MG PO CAPS
25.0000 mg | ORAL_CAPSULE | Freq: Four times a day (QID) | ORAL | Status: AC
  Administered 2014-04-06 – 2014-04-08 (×6): 25 mg via ORAL
  Filled 2014-04-06: qty 5
  Filled 2014-04-06: qty 1
  Filled 2014-04-06: qty 5
  Filled 2014-04-06: qty 1
  Filled 2014-04-06: qty 5

## 2014-04-06 MED ORDER — HYDROXYZINE HCL 25 MG PO TABS
25.0000 mg | ORAL_TABLET | Freq: Four times a day (QID) | ORAL | Status: DC | PRN
  Filled 2014-04-06: qty 1

## 2014-04-06 MED ORDER — CLONIDINE HCL 0.1 MG PO TABS: 0.1000 mg | ORAL_TABLET | Freq: Every day | ORAL | Status: DC

## 2014-04-06 MED ORDER — VITAMIN B-1 100 MG PO TABS
100.0000 mg | ORAL_TABLET | Freq: Every day | ORAL | Status: DC
  Administered 2014-04-07 – 2014-04-08 (×2): 100 mg via ORAL
  Filled 2014-04-06 (×2): qty 1

## 2014-04-06 MED ORDER — THIAMINE HCL 100 MG/ML IJ SOLN
100.0000 mg | Freq: Once | INTRAMUSCULAR | Status: AC
  Administered 2014-04-06: 100 mg via INTRAMUSCULAR
  Filled 2014-04-06: qty 1

## 2014-04-06 MED ORDER — ENOXAPARIN SODIUM 40 MG/0.4ML ~~LOC~~ SOLN
40.0000 mg | SUBCUTANEOUS | Status: DC
  Administered 2014-04-07 – 2014-04-08 (×2): 40 mg via SUBCUTANEOUS
  Filled 2014-04-06 (×3): qty 0.4

## 2014-04-06 MED ORDER — CHLORDIAZEPOXIDE HCL 25 MG PO CAPS: 25.0000 mg | ORAL_CAPSULE | Freq: Four times a day (QID) | ORAL | Status: DC | PRN

## 2014-04-06 MED ORDER — SODIUM CHLORIDE 0.9 % IV SOLN
INTRAVENOUS | Status: DC
  Administered 2014-04-06: 999 mL via INTRAVENOUS
  Administered 2014-04-07: 50 mL/h via INTRAVENOUS

## 2014-04-06 MED ORDER — ACETAMINOPHEN 650 MG RE SUPP: 650.0000 mg | Freq: Four times a day (QID) | RECTAL | Status: DC | PRN

## 2014-04-06 MED ORDER — CLONIDINE HCL 0.1 MG PO TABS: 0.1000 mg | ORAL_TABLET | ORAL | Status: DC

## 2014-04-06 MED ORDER — CHLORDIAZEPOXIDE HCL 25 MG PO CAPS: 25.0000 mg | ORAL_CAPSULE | Freq: Every day | ORAL | Status: DC

## 2014-04-06 MED ORDER — DICYCLOMINE HCL 20 MG PO TABS
20.0000 mg | ORAL_TABLET | Freq: Four times a day (QID) | ORAL | Status: DC | PRN
  Filled 2014-04-06: qty 1

## 2014-04-06 MED ORDER — LOPERAMIDE HCL 2 MG PO CAPS: 2.0000 mg | ORAL_CAPSULE | ORAL | Status: DC | PRN

## 2014-04-06 MED ORDER — CHLORDIAZEPOXIDE HCL 25 MG PO CAPS
25.0000 mg | ORAL_CAPSULE | Freq: Three times a day (TID) | ORAL | Status: DC
  Filled 2014-04-06: qty 1

## 2014-04-06 MED ORDER — NAPROXEN 500 MG PO TABS
500.0000 mg | ORAL_TABLET | Freq: Two times a day (BID) | ORAL | Status: DC | PRN
  Filled 2014-04-06: qty 1

## 2014-04-06 NOTE — ED Provider Notes (Signed)
I have personally seen and examined the patient.  I have discussed the plan of care with the resident.  I have reviewed the documentation on PMH/FH/Soc. History.  I have reviewed the documentation of the resident and agree.  Doug SouSam Rayshell Goecke, MD 04/06/14 [phone]

## 2014-04-06 NOTE — ED Provider Notes (Signed)
I have personally seen and examined the patient.  I have discussed the plan of care with the resident.  I have reviewed the documentation on PMH/FH/Soc. History.  I have reviewed the documentation of the resident and agree.  Elaysia Devargas, MD 04/06/14 0035 

## 2014-04-06 NOTE — Progress Notes (Signed)
[facility] Progress Note  Mamie Laurelaron Taylor Stanczyk XLK:[id number] DOB: 1981-02-04 DOA: 04/05/2014 PCP: No PCP Per Patient  Brief narrative: 33 y.o. male who was referred to the ED from the [facility] where he had presented for Detox and rehab therapy. Upon arrival was noted to be very lethargic and had endorsed heroin use 30 minutes prior to arrival to their center so was denied admission due to inability to be medically cleared.   [facility] where a UDS was performed which returned positive for Opiates, Cocaine, Benzodiazepines, and THC. Despite monitoring in the ER his lethargy persisted and he at times was obtunded. He was not given any reversal agents. He remained hemodynamically stable. When he was awake he apparently endorsed a 2 gm per day heroin habit.  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 rate 14, height 5\' 10"  (1.778 m), weight 146 lb 13.2 oz (66.6 kg), SpO2  100.00%.  Intake/Output Summary (Last 24 hours) at 04/06/14 1105 Last data filed at 04/05/14 1952  Gross per 24 hour  Intake      0 ml  Output   1050 ml  Net  -1050 ml   Exam: Follow up exam completed-patient admitted at 3:20 AM today  Scheduled Meds:  Scheduled Meds: . enoxaparin (LOVENOX) injection  40 mg Subcutaneous Q24H  . pantoprazole (PROTONIX) IV  40 mg Intravenous Q24H   Data Reviewed: Basic Metabolic Panel:  Recent Labs Lab 04/05/14 1847 04/06/14 0504  NA 140 141  K 4.7 4.0  CL 101 104  CO2 27 24  GLUCOSE 100* 97  BUN 11 9  CREATININE 0.92 0.86  CALCIUM 9.5 8.8   Liver Function Tests:  Recent Labs Lab 04/05/14 1847  AST 31  ALT 23  ALKPHOS 94  BILITOT 0.4  PROT 7.9  ALBUMIN 4.1   CBC:  Recent Labs Lab 04/05/14 1847 04/06/14 0504  WBC 8.7 6.9  HGB 14.4 13.2  HCT 43.7 40.5  MCV 90.5 89.8  PLT 232 203    Recent Results (from the past 240 hour(s))  MRSA PCR SCREENING     Status: None   Collection Time    04/06/14  1:20 AM      Result Value Ref Range Status   MRSA by PCR NEGATIVE  NEGATIVE Final   Comment:            The GeneXpert MRSA Assay (FDA     approved for NASAL specimens     only), is one component of a  comprehensive MRSA colonization     surveillance program. It is not     intended to diagnose MRSA     infection nor to guide or     monitor treatment for     MRSA infections.    Studies:  Recent x-ray studies have been reviewed in detail by the Attending Physician  Time spent : 25+ mins      Junious Silkllison Ellis, ANP [facility] Office  [phone] Pager [phone]   **If unable to reach the above provider after paging please contact the Flow Manager @ [phone]  On-Call/Text Page:      [email or web address]      password [email or web address]  If 7PM-7AM, please contact night-coverage [email or web address] Password [email or web address] 04/06/2014, 11:05 AM   LOS: 1 day   I have personally examined this patient and reviewed the entire database. I have  reviewed the above note, made any necessary editorial changes, and agree with its content.  Lonia BloodJeffrey T. McClung, MD [facility]

## 2014-04-06 NOTE — Consult Note (Addendum)
[facility] Psychiatry Consult   Reason for Consult:  Polysubstance abuse vs dependence Referring Physician:  Cherene Altes, MD  [facility] is an 33 y.o. male. Total Time spent with patient: 45 minutes  Assessment: AXIS I:  Polysubstance dependence  and opioid withdrawl AXIS II:  Deferred AXIS III:   Past Medical History  Diagnosis Date  . Hepatitis C   . Substance abuse    AXIS IV:  other psychosocial or environmental problems, problems related to social environment and problems with primary support group AXIS V:  31-40 impairment in reality testing  Plan: Case discussed with Cherene Altes, MD Will start Librium and Clonidine protocol for detox  Recommend psychiatric Inpatient admission when medically cleared. Supportive therapy provided about ongoing stressors. Appreciate psychiatric consultation Please contact [phone] if needs further assistance  Subjective:   [facility] is a 33 y.o. male patient admitted with polysubstance abuse vs dependence.  HPI:  Patient is seen, chart reviewed. He is awake, alert and oriented x 4. He stated that he was referred to [place] by Mr. Merry Proud from [facility] recovery center for detox on his heroin and benzos'. He has been dependence on drugs over 15 years, last detox August 2014. He was previously treated at [facility], and [facility]. He has previous treatment at [facility]. Reportedly he has been doing heroin 2 gms, xanax upto 20 pills and cocaine twice a month. He was previously treated with Librium and clonidine while in [place] for heroin and recently he was in jail for child support. He has 8 years old daughter stays with her mother in [place]. He stay with his mother in [place] and reportedly his dad died with heart attack which is related to drugs and alcohol. 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 He wants to go directly to [facility] recovery center to avoid relapse on drugs.   Medical history: [facility] is a 33 y.o.  male who was referred to the ED from the [facility] where he had presented for Detox and rehab therapy. He was lethargic and reported use of Heroin 30 minutes before he arrived, so he was not medically clear for admission there. He was seen in the ED at [facility] and was evaluated and a UDS was performed which returned positive for Opiates, Cocaine, Benzodiazepines, and THC. He was monitor in the ED but remained Obtunded, and lethargic. He was referred for medical admission. Of Note: He reports that he uses up to 2 grams of heroin daily and has uses heroin since the age of 58.   HPI Elements:  Location:  substance abuse. Quality:  poor. Severity:  withdrawal. Timing:  unable to detox in [facility].  Past Psychiatric History: Past Medical History  Diagnosis Date  . Hepatitis C   . Substance abuse     reports that he has been smoking Cigarettes.  He has been smoking about 0.50 packs per day. He does not have any smokeless tobacco history on file. He reports that he does not drink alcohol or use illicit drugs. History reviewed. No pertinent family history.   Living Arrangements: Alone   Abuse/Neglect [facility]) Physical Abuse: Denies Verbal Abuse: Denies Sexual Abuse: Denies Allergies:   Allergies  Allergen Reactions  . Ivp Dye [Iodinated Diagnostic Agents]     Unknown  . Penicillins Hives    ACT Assessment Complete:  NO Objective: Blood pressure 110/60, pulse 72, temperature 97.7 F (36.5 C), temperature source Oral, resp.  rate 10, height _0  (1.778 m), weight 66.6 kg (146 lb 13.2 oz), SpO2 100.00%.Body mass index is 21.07 kg/(m^2). Results for orders placed during the hospital encounter of 04/05/14 (from the past 72 hour(s))  CBC     Status: None   Collection Time    04/05/14  6:47 PM      Result Value Ref Range   WBC 8.7  4.0 - 10.5 K/uL   RBC 4.83  4.22 - 5.81 MIL/uL   Hemoglobin 14.4  13.0 - 17.0 g/dL   HCT 43.7  39.0 - 52.0 %   MCV 90.5  78.0 - 100.0 fL   MCH 29.8   26.0 - 34.0 pg   MCHC 33.0  30.0 - 36.0 g/dL   RDW 13.1  11.5 - 15.5 %   Platelets 232  150 - 400 K/uL  COMPREHENSIVE METABOLIC PANEL     Status: Abnormal   Collection Time    04/05/14  6:47 PM      Result Value Ref Range   Sodium 140  137 - 147 mEq/L   Potassium 4.7  3.7 - 5.3 mEq/L   Comment: HEMOLYSIS AT THIS LEVEL MAY AFFECT RESULT   Chloride 101  96 - 112 mEq/L   CO2 27  19 - 32 mEq/L   Glucose, Bld 100 (*) 70 - 99 mg/dL   BUN 11  6 - 23 mg/dL   Creatinine, Ser 0.92  0.50 - 1.35 mg/dL   Calcium 9.5  8.4 - 10.5 mg/dL   Total Protein 7.9  6.0 - 8.3 g/dL   Albumin 4.1  3.5 - 5.2 g/dL   AST 31  0 - 37 U/L   ALT 23  0 - 53 U/L   Alkaline Phosphatase 94  39 - 117 U/L   Total Bilirubin 0.4  0.3 - 1.2 mg/dL   GFR calc non Af Amer >90  >90 mL/min   GFR calc Af Amer >90  >90 mL/min   Comment: (NOTE)     The eGFR has been calculated using the CKD EPI equation.     This calculation has not been validated in all clinical situations.     eGFR's persistently <90 mL/min signify possible Chronic Kidney     Disease.   Anion gap 12  5 - 15  ETHANOL     Status: None   Collection Time    04/05/14  6:47 PM      Result Value Ref Range   Alcohol, Ethyl (B) <11  0 - 11 mg/dL   Comment:            LOWEST DETECTABLE LIMIT FOR     SERUM ALCOHOL IS 11 mg/dL     FOR MEDICAL PURPOSES ONLY  ACETAMINOPHEN LEVEL     Status: None   Collection Time    04/05/14  6:47 PM      Result Value Ref Range   Acetaminophen (Tylenol), Serum RESULTS UNAVAILABLE DUE TO INTERFERING SUBSTANCE  10 - 30 ug/mL   Comment: HEMOLYSIS                THERAPEUTIC CONCENTRATIONS VARY     SIGNIFICANTLY. A RANGE OF 10-30     ug/mL MAY BE AN EFFECTIVE     CONCENTRATION FOR MANY PATIENTS.     HOWEVER, SOME ARE BEST TREATED     AT CONCENTRATIONS OUTSIDE THIS     RANGE.     ACETAMINOPHEN CONCENTRATIONS     >150 ug/mL AT 4 HOURS  AFTER     INGESTION AND >50 ug/mL AT 12     HOURS AFTER INGESTION ARE     OFTEN ASSOCIATED  WITH TOXIC     REACTIONS.  SALICYLATE LEVEL     Status: Abnormal   Collection Time    04/05/14  6:47 PM      Result Value Ref Range   Salicylate Lvl <8.3 (*) 2.8 - 20.0 mg/dL  ACETAMINOPHEN LEVEL     Status: None   Collection Time    04/05/14  6:47 PM      Result Value Ref Range   Acetaminophen (Tylenol), Serum <15.0  10 - 30 ug/mL   Comment:            THERAPEUTIC CONCENTRATIONS VARY     SIGNIFICANTLY. A RANGE OF 10-30     ug/mL MAY BE AN EFFECTIVE     CONCENTRATION FOR MANY PATIENTS.     HOWEVER, SOME ARE BEST TREATED     AT CONCENTRATIONS OUTSIDE THIS     RANGE.     ACETAMINOPHEN CONCENTRATIONS     >150 ug/mL AT 4 HOURS AFTER     INGESTION AND >50 ug/mL AT 12     HOURS AFTER INGESTION ARE     OFTEN ASSOCIATED WITH TOXIC     REACTIONS.  URINE RAPID DRUG SCREEN (HOSP PERFORMED)     Status: Abnormal   Collection Time    04/05/14  7:51 PM      Result Value Ref Range   Opiates POSITIVE (*) NONE DETECTED   Cocaine POSITIVE (*) NONE DETECTED   Benzodiazepines POSITIVE (*) NONE DETECTED   Amphetamines NONE DETECTED  NONE DETECTED   Tetrahydrocannabinol POSITIVE (*) NONE DETECTED   Barbiturates NONE DETECTED  NONE DETECTED   Comment:            DRUG SCREEN FOR MEDICAL PURPOSES     ONLY.  IF CONFIRMATION IS NEEDED     FOR ANY PURPOSE, NOTIFY LAB     WITHIN 5 DAYS.                LOWEST DETECTABLE LIMITS     FOR URINE DRUG SCREEN     Drug Class       Cutoff (ng/mL)     Amphetamine      1000     Barbiturate      200     Benzodiazepine   254     Tricyclics       982     Opiates          300     Cocaine          300     THC              50  MRSA PCR SCREENING     Status: None   Collection Time    04/06/14  1:20 AM      Result Value Ref Range   MRSA by PCR NEGATIVE  NEGATIVE   Comment:            The GeneXpert MRSA Assay (FDA     approved for NASAL specimens     only), is one component of a     comprehensive MRSA colonization     surveillance program. It is not      intended to diagnose MRSA     infection nor to guide or     monitor treatment for     MRSA infections.  BASIC METABOLIC PANEL     Status: None   Collection Time    04/06/14  5:04 AM      Result Value Ref Range   Sodium 141  137 - 147 mEq/L   Potassium 4.0  3.7 - 5.3 mEq/L   Chloride 104  96 - 112 mEq/L   CO2 24  19 - 32 mEq/L   Glucose, Bld 97  70 - 99 mg/dL   BUN 9  6 - 23 mg/dL   Creatinine, Ser 0.86  0.50 - 1.35 mg/dL   Calcium 8.8  8.4 - 10.5 mg/dL   GFR calc non Af Amer >90  >90 mL/min   GFR calc Af Amer >90  >90 mL/min   Comment: (NOTE)     The eGFR has been calculated using the CKD EPI equation.     This calculation has not been validated in all clinical situations.     eGFR's persistently <90 mL/min signify possible Chronic Kidney     Disease.   Anion gap 13  5 - 15  CBC     Status: None   Collection Time    04/06/14  5:04 AM      Result Value Ref Range   WBC 6.9  4.0 - 10.5 K/uL   RBC 4.51  4.22 - 5.81 MIL/uL   Hemoglobin 13.2  13.0 - 17.0 g/dL   HCT 40.5  39.0 - 52.0 %   MCV 89.8  78.0 - 100.0 fL   MCH 29.3  26.0 - 34.0 pg   MCHC 32.6  30.0 - 36.0 g/dL   RDW 13.3  11.5 - 15.5 %   Platelets 203  150 - 400 K/uL   Labs are reviewed and are pertinent for multiple drugs of abuse.  Current Facility-Administered Medications  Medication Dose Route Frequency Provider Last Rate Last Dose  . 0.9 %  sodium chloride infusion   Intravenous Continuous Theressa Millard, MD 100 mL/hr at 04/06/14 1045 999 mL at 04/06/14 1045  . 0.9 %  sodium chloride infusion   Intravenous Continuous Samella Parr, NP      . acetaminophen (TYLENOL) tablet 650 mg  650 mg Oral Q6H PRN Theressa Millard, MD       Or  . acetaminophen (TYLENOL) suppository 650 mg  650 mg Rectal Q6H PRN Theressa Millard, MD      . alum & mag hydroxide-simeth (MAALOX/MYLANTA) 200-200-20 MG/5ML suspension 30 mL  30 mL Oral Q6H PRN Harvette Evonnie Dawes, MD      . enoxaparin (LOVENOX) injection 40 mg  40 mg  Subcutaneous Q24H Harvette Evonnie Dawes, MD      . ondansetron (ZOFRAN) tablet 4 mg  4 mg Oral Q6H PRN Theressa Millard, MD       Or  . ondansetron (ZOFRAN) injection 4 mg  4 mg Intravenous Q6H PRN Theressa Millard, MD      . pantoprazole (PROTONIX) injection 40 mg  40 mg Intravenous Q24H Theressa Millard, MD   40 mg at 04/06/14 0207    Psychiatric Specialty Exam: Physical Exam  ROS  Blood pressure 110/60, pulse 72, temperature 97.7 F (36.5 C), temperature source Oral, resp. rate 10, height _0  (1.778 m), weight 66.6 kg (146 lb 13.2 oz), SpO2 100.00%.Body mass index is 21.07 kg/(m^2).  General Appearance: Casual  Eye Contact::  Good  Speech:  Clear and Coherent  Volume:  Normal  Mood:  Anxious and Depressed  Affect:  Appropriate and  Congruent  Thought Process:  Coherent and Goal Directed  Orientation:  Full (Time, Place, and Person)  Thought Content:  WDL  Suicidal Thoughts:  No  Homicidal Thoughts:  No  Memory:  Immediate;   Fair Recent;   Fair  Judgement:  Intact  Insight:  Lacking  Psychomotor Activity:  Psychomotor Retardation and Restlessness  Concentration:  Fair  Recall:  Good  Fund of Knowledge:Good  Language: Good  Akathisia:  NA  Handed:  Right  AIMS (if indicated):     Assets:  Communication Skills Desire for Improvement Housing Intimacy Leisure Time Physical Health Resilience Social Support Talents/Skills  Sleep:      Musculoskeletal: Strength & Muscle Tone: within normal limits Gait & Station: normal Patient leans: N/A  Treatment Plan Summary: Daily contact with patient to assess and evaluate symptoms and progress in treatment Medication management  [facility],JANARDHAHA R. 04/06/2014 1:17 PM

## 2014-04-06 NOTE — H&P (Signed)
[facility] History and Physical  Caleb Laurelaron Taylor Nodal WJX:[id number] DOB: 30-Apr-1981 DOA: 04/05/2014  Referring physician:  EDP PCP: No PCP Per Patient  Specialists:   Chief Complaint:  Lethargic  HPI: Caleb Bush is a 33 y.o. male who was referred to the ED from the [facility] where he had presented for Detox and rehab therapy.  He was lethargic and reported use of Heroin 30 minutes before he arrived, so he was not medically clear for admission there.  [facility] and was evaluated and a UDS was performed which returned positive for Opiates, Cocaine, Benzodiazepines, and THC.   He was monitor in the ED but remained Obtunded, and lethargic.  He was referred for medical admission.  Of Note:  He reports that he uses up to 2 grams of heroin daily and has uses heroin since the age of 33.      Review of Systems:  Constitutional: No Weight Loss, No Weight Gain, Night Sweats, Fevers, Chills, Fatigue, or Generalized Weakness HEENT: No Headaches, Difficulty Swallowing,Tooth/Dental Problems,Sore Throat,  No Sneezing, Rhinitis, Ear Ache, Nasal Congestion, or Post Nasal Drip,  Cardio-vascular:  No Chest pain, Orthopnea, PND, Edema in lower extremities, Anasarca, Dizziness, Palpitations  Resp: No Dyspnea, No DOE, No Productive Cough, No Non-Productive Cough, No Hemoptysis, No Change in Color of Mucus,  No Wheezing.    GI: No Heartburn, Indigestion, Abdominal Pain, Nausea, Vomiting, Diarrhea, Change in Bowel Habits,  Loss of Appetite  GU: No Dysuria, Change in Color of Urine, No Urgency or Frequency.  No flank pain.  Musculoskeletal: No Joint Pain or Swelling.  No Decreased Range of Motion. No Back Pain.  Neurologic: No Syncope, No Seizures, Muscle Weakness, Paresthesia, Vision Disturbance or Loss, No Diplopia, No Vertigo, No Difficulty Walking,  Skin: No Rash or Lesions. Psych: No Change in Mood or Affect. No Depression or Anxiety. No Memory loss. No Confusion or  Hallucinations   Past Medical History  Diagnosis Date  . Hepatitis C   . Substance abuse       History reviewed. No pertinent past surgical history.     Prior to Admission medications   Not on File      Allergies  Allergen Reactions  . Ivp Dye [Iodinated Diagnostic Agents]     Unknown  . Penicillins Hives     Social History:  reports that he has been smoking Cigarettes.  He has been smoking about 0.50 packs per day. He does not have any smokeless tobacco history on file. He reports that he does not drink alcohol or use illicit drugs.     History reviewed. No pertinent family history.     Physical Exam:  GEN:  Obtunded Thin well Developed  33 y.o. Caucasian male examined and in no acute distress; cooperative with exam Filed Vitals:   04/06/14 0000 04/06/14 0015 04/06/14 0030 04/06/14 0115  BP: 126/67 118/64 122/61 125/74  Pulse: 77 78 73   Temp:    98 F (36.7 C)  TempSrc:    Axillary  Resp: 11 10 12 9   Height:    5\' 10"  (1.778 m)  Weight:    66.6 kg (146 lb 13.2 oz)  SpO2: 100% 100% 100% 100%   Blood pressure 125/74, pulse 73, temperature 98 F (36.7 C), temperature source Axillary, resp. rate 9, height 5\' 10"  (1.778 m), weight 66.6 kg (146 lb 13.2 oz), SpO2 100.00%. PSYCH: He is alert and oriented x4; Obtunded but arrousable HEENT:  Normocephalic and Atraumatic, Mucous membranes pink; PERRLA; EOM intact; Fundi:  Benign;  No scleral icterus, Nares: Patent, Oropharynx: Clear, Fair Dentition, Neck:  FROM, no cervical lymphadenopathy nor thyromegaly or carotid bruit; no JVD; Breasts:: Not examined CHEST WALL: No tenderness CHEST: Normal respiration, clear to auscultation bilaterally HEART: Regular rate and rhythm; no murmurs rubs or gallops BACK: No kyphosis or scoliosis; no CVA tenderness ABDOMEN: Positive Bowel Sounds, soft non-tender; no masses, no organomegaly, no pannus; no intertriginous candida. Rectal Exam: Not done EXTREMITIES: No cyanosis, clubbing  or edema; no ulcerations. Genitalia: not examined PULSES: 2+ and symmetric SKIN: Normal hydration no rash or ulceration CNS:  Obtunded, but Arousable,  Answers Questions appropriately, but is lethargic and sluggish,  Able to move all 4 exts, No focal deficits.    Vascular: pulses palpable throughout    Labs on Admission:  Basic Metabolic Panel:  Recent Labs Lab 04/05/14 1847  NA 140  K 4.7  CL 101  CO2 27  GLUCOSE 100*  BUN 11  CREATININE 0.92  CALCIUM 9.5   Liver Function Tests:  Recent Labs Lab 04/05/14 1847  AST 31  ALT 23  ALKPHOS 94  BILITOT 0.4  PROT 7.9  ALBUMIN 4.1   No results found for this basename: LIPASE, AMYLASE,  in the last 168 hours No results found for this basename: AMMONIA,  in the last 168 hours CBC:  Recent Labs Lab 04/05/14 1847  WBC 8.7  HGB 14.4  HCT 43.7  MCV 90.5  PLT 232   Cardiac Enzymes: No results found for this basename: CKTOTAL, CKMB, CKMBINDEX, TROPONINI,  in the last 168 hours  BNP (last 3 results) No results found for this basename: PROBNP,  in the last 8760 hours CBG: No results found for this basename: GLUCAP,  in the last 168 hours  Radiological Exams on Admission: No results found.    EKG: Independently reviewed. Normal  Sinus Rhythm at rate 77,  No Acute Changes.      Assessment/Plan:   33 y.o. male with  Principal Problem:   Heroin overdose Active Problems:   Heroin abuse   Polysubstance abuse   Altered mental status   Hepatitis, chronic    1.  Heroin Overdose-   Unintentional, Admit to Stepdown, monitor for Neurologic, cardiac, and respiratory changes.     2.  Polysubstance abuse-  +UDS for Opiates, Cocaine, and THC,   Rx same as #1.    3.  Altered Mental Status - due to #1 and #2,    4.  Hepatitis C, Chronic-  Monitor LFTs.    5.  DVT Prophylaxis with Lovenox, Monitor Platelets.        Code Status:   FULL CODE    Family Communication:     Disposition Plan:         Time spent:    Ron ParkerJENKINS,Caleb Bush C [facility] Pager [phone]  If 7PM-7AM, please contact night-coverage [email or web address] Password [email or web address] 04/06/2014, 3:20 AM

## 2014-04-06 NOTE — ED Notes (Signed)
Left mother voice mail that patient ws admitted to [facility] room 3.

## 2014-04-07 DIAGNOSIS — J9600 Acute respiratory failure, unspecified whether with hypoxia or hypercapnia: Secondary | ICD-10-CM

## 2014-04-07 DIAGNOSIS — F19939 Other psychoactive substance use, unspecified with withdrawal, unspecified: Secondary | ICD-10-CM

## 2014-04-07 DIAGNOSIS — F1920 Other psychoactive substance dependence, uncomplicated: Secondary | ICD-10-CM

## 2014-04-07 MED ORDER — CLONIDINE HCL 0.1 MG PO TABS
0.1000 mg | ORAL_TABLET | Freq: Four times a day (QID) | ORAL | Status: DC
  Administered 2014-04-07: 0.1 mg via ORAL
  Filled 2014-04-07 (×6): qty 1

## 2014-04-07 MED ORDER — TRAZODONE HCL 100 MG PO TABS
100.0000 mg | ORAL_TABLET | Freq: Every evening | ORAL | Status: DC | PRN
Start: 2014-04-07 — End: 2014-04-08
  Filled 2014-04-07: qty 1

## 2014-04-07 MED ORDER — CLONIDINE HCL 0.1 MG PO TABS: 0.1000 mg | ORAL_TABLET | Freq: Every day | ORAL | Status: DC

## 2014-04-07 MED ORDER — PANTOPRAZOLE SODIUM 40 MG PO TBEC
40.0000 mg | DELAYED_RELEASE_TABLET | Freq: Every day | ORAL | Status: DC
  Administered 2014-04-07: 40 mg via ORAL
  Filled 2014-04-07: qty 1

## 2014-04-07 MED ORDER — CLONIDINE HCL 0.1 MG PO TABS: 0.1000 mg | ORAL_TABLET | ORAL | Status: DC

## 2014-04-07 NOTE — Progress Notes (Signed)
[facility]  Mamie Laurelaron Taylor Ober WUJ:[id number] DOB: 09/23/1981 DOA: 04/05/2014 PCP: No PCP Per Patient  Brief narrative: 33 y.o. male who was referred to the ED from [facility] where he had presented for Detox and rehab therapy. Upon arrival was noted to be very lethargic and had endorsed heroin use 30 minutes prior to arrival to their center so was denied admission due to inability to be medically cleared.   He was [facility] where a UDS was performed which returned positive for Opiates, Cocaine, Benzodiazepines, and THC. Despite monitoring in the ER his lethargy persisted and he at times was obtunded. He was not given any reversal agents. He remained hemodynamically stable. When he was awake he apparently endorsed a 2 gm per day heroin habit.  HPI/Subjective: Awake and c/o low back pain which id chronic and apparently reason for heroin use-asking when can dc to [facility]  Assessment/Plan:    Heroin abuse -still somewhat lethargic, but clearly improved than on admission, not stable for dc to [facility] (voluntary treatment center)-pt has previous failed treatment with [facility] -consulted Psych in event needs to begin Methadone before [facility] -Psych has initiated Librium and Clonidine this admission and has recommended IP admission when medically cleared    Acute respiratory failure with hypoxia -due to AMS and related hypoventilation-now resolved    Altered mental status -due to acute intoxication from narcs and BZDs -allow to wash out to prevent withdrawal seizures -cont IVFs since 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 rate 19, height 5\' 10"  (1.778 m), weight 146 lb 13.2 oz (66.6 kg), SpO2 99.00%.  Intake/Output Summary (Last 24 hours) at 04/07/14 1048 Last data filed at 04/07/14 1000  Gross per 24 hour  Intake    600 ml  Output    700 ml  Net   -100 ml   Exam: General: No acute respiratory distress Lungs: Clear to auscultation bilaterally without wheezes or crackles, RA Cardiovascular: Regular rate and rhythm without murmur gallop or rub normal S1 and S2, no peripheral edema or JVD Abdomen: Nontender, nondistended, soft, bowel sounds positive, no rebound, no ascites, no appreciable mass Musculoskeletal: No significant cyanosis, clubbing of bilateral lower extremities Neurological: Awakens and oriented x 3, moves all extremities x 4 without focal neurological deficits  Scheduled Meds:  Scheduled Meds: . chlordiazePOXIDE  25 mg Oral QID   Followed by  . [START ON 04/08/2014] chlordiazePOXIDE  25 mg Oral TID   Followed by  . [START ON 04/09/2014] chlordiazePOXIDE  25 mg Oral BH-qamhs   Followed by  . [START ON 04/10/2014] chlordiazePOXIDE  25 mg Oral Daily  . cloNIDine  0.1 mg Oral QID   Followed by  . [START ON 04/09/2014] cloNIDine  0.1 mg Oral BH-qamhs   Followed by  . [START ON 04/11/2014] cloNIDine  0.1 mg Oral QAC breakfast  . enoxaparin (LOVENOX) injection  40 mg Subcutaneous Q24H  . multivitamin with  minerals  1 tablet Oral Daily  . pantoprazole (PROTONIX) IV  40 mg Intravenous Q24H  . thiamine  100 mg Oral Daily   Data Reviewed: Basic Metabolic Panel:  Recent Labs Lab 04/05/14 1847 04/06/14 0504  NA 140 141  K 4.7 4.0  CL 101 104  CO2 27 24  GLUCOSE 100* 97  BUN 11 9  CREATININE 0.92 0.86  CALCIUM 9.5 8.8   Liver Function Tests:  Recent Labs Lab  04/05/14 1847  AST 31  ALT 23  ALKPHOS 94  BILITOT 0.4  PROT 7.9  ALBUMIN 4.1   CBC:  Recent Labs Lab 04/05/14 1847 04/06/14 0504  WBC 8.7 6.9  HGB 14.4 13.2  HCT 43.7 40.5  MCV 90.5 89.8  PLT 232 203    Recent Results (from the past 240 hour(s))  MRSA PCR SCREENING     Status: None   Collection Time    04/06/14  1:20 AM      Result Value Ref Range Status   MRSA by PCR NEGATIVE  NEGATIVE Final   Comment:            The GeneXpert MRSA Assay (FDA     approved for NASAL specimens     only), is one component of a     comprehensive MRSA colonization     surveillance program. It is not     intended to diagnose MRSA     infection nor to guide or     monitor treatment for     MRSA infections.    Studies:  Recent x-ray studies have been reviewed in detail by the Attending Physician  Time spent : 25+ mins      Junious Silk, ANP [facility] Office  [phone] Pager [phone]   **If unable to reach the above provider after paging please contact the Flow Manager @ [phone]  On-Call/Text Page:      [email or web address]      password [email or web address]  If 7PM-7AM, please contact night-coverage [email or web address] Password [email or web address] 04/07/2014, 10:48 AM   LOS: 2 days   Attending Patient was seen, examined,treatment plan was discussed with the Physician extender. I have directly reviewed the clinical findings, lab, imaging studies and management of this patient in detail. I have made the necessary changes to the above noted documentation, and agree with the documentation, as recorded by the Physician extender.  Windell Norfolk MD [facility].

## 2014-04-07 NOTE — Progress Notes (Signed)
Noted consult to social work--psych CSW informed. This CSW signing off.   Maryclare LabradorJulie Tryston Gilliam, MSW, LCSWA Clinical Social Worker

## 2014-04-07 NOTE — Progress Notes (Signed)
Report called to Katie, RN

## 2014-04-07 NOTE — Consult Note (Signed)
Caleb Bush   Reason for Bush:  Polysubstance abuse vs dependence Referring Physician:  Cherene Altes, MD  Caleb Bush is an 33 y.o. male. Total Time spent with patient: 45 minutes  Assessment: AXIS I:  Polysubstance dependence  and opioid withdrawl AXIS II:  Deferred AXIS III:   Past Medical History  Diagnosis Date  . Hepatitis C   . Substance abuse    AXIS IV:  other psychosocial or environmental problems, problems related to social environment and problems with primary support group AXIS V:  31-40 impairment in reality testing  Plan: Rec Trazodone 100 mg Qhs PRN for insomnia. Continue Librium and Clonidine protocol for detox  Recommend psychiatric Inpatient admission when medically cleared. Supportive therapy provided about ongoing stressors. Appreciate psychiatric consultation Please contact [phone] if needs further assistance  Subjective:   Caleb Bush is a 33 y.o. male patient admitted with polysubstance abuse vs dependence.  HPI:  Patient is seen, chart reviewed. He is awake, alert and oriented x 4. He stated that he was referred to [place] by Mr. Merry Proud from [facility] recovery center for detox on his heroin and benzos'. He has been dependence on drugs over 15 years, last detox August 2014. He was previously treated at [facility], and [facility]. He has previous treatment at [facility]. Reportedly he has been doing heroin 2 gms, xanax upto 20 pills and cocaine twice a month. He was previously treated with Librium and clonidine while in [place] for heroin and recently he was in jail for child support. He has 19 years old daughter stays with her mother in [place]. He stay with his mother in [place] and reportedly his dad died with heart attack which is related to drugs and alcohol. 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 He wants to go directly to [facility] recovery center to avoid relapse on drugs.   Interval History: Complained of generalized  weakness, body pains, sweating, tremors and poor sleep over night. He is awake but sluggish in his bed, able to respond verbally and queries about how the treatment takes and still wants to go to [facility] recovery for in patient substance rehab upon successfully completing detox protocol. May use trazodone 100 mg Qhs for insomnia.  Medical history: Caleb Bush is a 33 y.o. male who was referred to the ED from the [facility] where he had presented for Detox and rehab therapy. He was lethargic and reported use of Heroin 30 minutes before he arrived, so he was not medically clear for admission there. He was seen in the ED at [facility] and was evaluated and a UDS was performed which returned positive for Opiates, Cocaine, Benzodiazepines, and THC. He was monitor in the ED but remained Obtunded, and lethargic. He was referred for medical admission. Of Note: He reports that he uses up to 2 grams of heroin daily and has uses heroin since the age of 85.   HPI Elements:  Location:  substance abuse. Quality:  poor. Severity:  withdrawal. Timing:  unable to detox in [facility].  Past Psychiatric History: Past Medical History  Diagnosis Date  . Hepatitis C   . Substance abuse     reports that he has been smoking Cigarettes.  He has been smoking about 0.50 packs per day. He does not have any smokeless tobacco history on file. He reports that he does not drink alcohol or use illicit drugs. History reviewed. No pertinent family history.   Living  Arrangements: Alone   Abuse/Neglect [facility]) Physical Abuse: Denies Verbal Abuse: Denies Sexual Abuse: Denies Allergies:   Allergies  Allergen Reactions  . Ivp Dye [Iodinated Diagnostic Agents]     Unknown  . Penicillins Hives    ACT Assessment Complete:  NO Objective: Blood pressure 127/74, pulse 55, temperature 97.6 F (36.4 C), temperature source Oral, resp. rate 19, height $RemoveBe'5\' 10"'YQPVXMoHe$  (1.778 m), weight 66.6 kg (146 lb 13.2 oz), SpO2 99.00%.Body mass  index is 21.07 kg/(m^2). Results for orders placed during the hospital encounter of 04/05/14 (from the past 72 hour(s))  CBC     Status: None   Collection Time    04/05/14  6:47 PM      Result Value Ref Range   WBC 8.7  4.0 - 10.5 K/uL   RBC 4.83  4.22 - 5.81 MIL/uL   Hemoglobin 14.4  13.0 - 17.0 g/dL   HCT 43.7  39.0 - 52.0 %   MCV 90.5  78.0 - 100.0 fL   MCH 29.8  26.0 - 34.0 pg   MCHC 33.0  30.0 - 36.0 g/dL   RDW 13.1  11.5 - 15.5 %   Platelets 232  150 - 400 K/uL  COMPREHENSIVE METABOLIC PANEL     Status: Abnormal   Collection Time    04/05/14  6:47 PM      Result Value Ref Range   Sodium 140  137 - 147 mEq/L   Potassium 4.7  3.7 - 5.3 mEq/L   Comment: HEMOLYSIS AT THIS LEVEL MAY AFFECT RESULT   Chloride 101  96 - 112 mEq/L   CO2 27  19 - 32 mEq/L   Glucose, Bld 100 (*) 70 - 99 mg/dL   BUN 11  6 - 23 mg/dL   Creatinine, Ser 0.92  0.50 - 1.35 mg/dL   Calcium 9.5  8.4 - 10.5 mg/dL   Total Protein 7.9  6.0 - 8.3 g/dL   Albumin 4.1  3.5 - 5.2 g/dL   AST 31  0 - 37 U/L   ALT 23  0 - 53 U/L   Alkaline Phosphatase 94  39 - 117 U/L   Total Bilirubin 0.4  0.3 - 1.2 mg/dL   GFR calc non Af Amer >90  >90 mL/min   GFR calc Af Amer >90  >90 mL/min   Comment: (NOTE)     The eGFR has been calculated using the CKD EPI equation.     This calculation has not been validated in all clinical situations.     eGFR's persistently <90 mL/min signify possible Chronic Kidney     Disease.   Anion gap 12  5 - 15  ETHANOL     Status: None   Collection Time    04/05/14  6:47 PM      Result Value Ref Range   Alcohol, Ethyl (B) <11  0 - 11 mg/dL   Comment:            LOWEST DETECTABLE LIMIT FOR     SERUM ALCOHOL IS 11 mg/dL     FOR MEDICAL PURPOSES ONLY  ACETAMINOPHEN LEVEL     Status: None   Collection Time    04/05/14  6:47 PM      Result Value Ref Range   Acetaminophen (Tylenol), Serum RESULTS UNAVAILABLE DUE TO INTERFERING SUBSTANCE  10 - 30 ug/mL   Comment: HEMOLYSIS                 THERAPEUTIC CONCENTRATIONS VARY  SIGNIFICANTLY. A RANGE OF 10-30     ug/mL MAY BE AN EFFECTIVE     CONCENTRATION FOR MANY PATIENTS.     HOWEVER, SOME ARE BEST TREATED     AT CONCENTRATIONS OUTSIDE THIS     RANGE.     ACETAMINOPHEN CONCENTRATIONS     >150 ug/mL AT 4 HOURS AFTER     INGESTION AND >50 ug/mL AT 12     HOURS AFTER INGESTION ARE     OFTEN ASSOCIATED WITH TOXIC     REACTIONS.  SALICYLATE LEVEL     Status: Abnormal   Collection Time    04/05/14  6:47 PM      Result Value Ref Range   Salicylate Lvl <0.6 (*) 2.8 - 20.0 mg/dL  ACETAMINOPHEN LEVEL     Status: None   Collection Time    04/05/14  6:47 PM      Result Value Ref Range   Acetaminophen (Tylenol), Serum <15.0  10 - 30 ug/mL   Comment:            THERAPEUTIC CONCENTRATIONS VARY     SIGNIFICANTLY. A RANGE OF 10-30     ug/mL MAY BE AN EFFECTIVE     CONCENTRATION FOR MANY PATIENTS.     HOWEVER, SOME ARE BEST TREATED     AT CONCENTRATIONS OUTSIDE THIS     RANGE.     ACETAMINOPHEN CONCENTRATIONS     >150 ug/mL AT 4 HOURS AFTER     INGESTION AND >50 ug/mL AT 12     HOURS AFTER INGESTION ARE     OFTEN ASSOCIATED WITH TOXIC     REACTIONS.  URINE RAPID DRUG SCREEN (HOSP PERFORMED)     Status: Abnormal   Collection Time    04/05/14  7:51 PM      Result Value Ref Range   Opiates POSITIVE (*) NONE DETECTED   Cocaine POSITIVE (*) NONE DETECTED   Benzodiazepines POSITIVE (*) NONE DETECTED   Amphetamines NONE DETECTED  NONE DETECTED   Tetrahydrocannabinol POSITIVE (*) NONE DETECTED   Barbiturates NONE DETECTED  NONE DETECTED   Comment:            DRUG SCREEN FOR MEDICAL PURPOSES     ONLY.  IF CONFIRMATION IS NEEDED     FOR ANY PURPOSE, NOTIFY LAB     WITHIN 5 DAYS.                LOWEST DETECTABLE LIMITS     FOR URINE DRUG SCREEN     Drug Class       Cutoff (ng/mL)     Amphetamine      1000     Barbiturate      200     Benzodiazepine   269     Tricyclics       485     Opiates          300     Cocaine           300     THC              50  MRSA PCR SCREENING     Status: None   Collection Time    04/06/14  1:20 AM      Result Value Ref Range   MRSA by PCR NEGATIVE  NEGATIVE   Comment:            The GeneXpert MRSA Assay (FDA     approved for NASAL  specimens     only), is one component of a     comprehensive MRSA colonization     surveillance program. It is not     intended to diagnose MRSA     infection nor to guide or     monitor treatment for     MRSA infections.  BASIC METABOLIC PANEL     Status: None   Collection Time    04/06/14  5:04 AM      Result Value Ref Range   Sodium 141  137 - 147 mEq/L   Potassium 4.0  3.7 - 5.3 mEq/L   Chloride 104  96 - 112 mEq/L   CO2 24  19 - 32 mEq/L   Glucose, Bld 97  70 - 99 mg/dL   BUN 9  6 - 23 mg/dL   Creatinine, Ser 0.86  0.50 - 1.35 mg/dL   Calcium 8.8  8.4 - 10.5 mg/dL   GFR calc non Af Amer >90  >90 mL/min   GFR calc Af Amer >90  >90 mL/min   Comment: (NOTE)     The eGFR has been calculated using the CKD EPI equation.     This calculation has not been validated in all clinical situations.     eGFR's persistently <90 mL/min signify possible Chronic Kidney     Disease.   Anion gap 13  5 - 15  CBC     Status: None   Collection Time    04/06/14  5:04 AM      Result Value Ref Range   WBC 6.9  4.0 - 10.5 K/uL   RBC 4.51  4.22 - 5.81 MIL/uL   Hemoglobin 13.2  13.0 - 17.0 g/dL   HCT 40.5  39.0 - 52.0 %   MCV 89.8  78.0 - 100.0 fL   MCH 29.3  26.0 - 34.0 pg   MCHC 32.6  30.0 - 36.0 g/dL   RDW 13.3  11.5 - 15.5 %   Platelets 203  150 - 400 K/uL   Labs are reviewed and are pertinent for multiple drugs of abuse.  Current Facility-Administered Medications  Medication Dose Route Frequency Provider Last Rate Last Dose  . 0.9 %  sodium chloride infusion   Intravenous Continuous Cherene Altes, MD 50 mL/hr at 04/06/14 1841 50 mL/hr at 04/06/14 1841  . acetaminophen (TYLENOL) tablet 650 mg  650 mg Oral Q6H PRN Theressa Millard, MD        Or  . acetaminophen (TYLENOL) suppository 650 mg  650 mg Rectal Q6H PRN Theressa Millard, MD      . alum & mag hydroxide-simeth (MAALOX/MYLANTA) 200-200-20 MG/5ML suspension 30 mL  30 mL Oral Q6H PRN Theressa Millard, MD      . chlordiazePOXIDE (LIBRIUM) capsule 25 mg  25 mg Oral Q6H PRN Durward Parcel, MD      . chlordiazePOXIDE (LIBRIUM) capsule 25 mg  25 mg Oral QID Durward Parcel, MD   25 mg at 04/07/14 5621   Followed by  . [START ON 04/08/2014] chlordiazePOXIDE (LIBRIUM) capsule 25 mg  25 mg Oral TID Durward Parcel, MD       Followed by  . [START ON 04/09/2014] chlordiazePOXIDE (LIBRIUM) capsule 25 mg  25 mg Oral BH-qamhs Durward Parcel, MD       Followed by  . [START ON 04/10/2014] chlordiazePOXIDE (LIBRIUM) capsule 25 mg  25 mg Oral Daily Durward Parcel, MD      .  cloNIDine (CATAPRES) tablet 0.1 mg  0.1 mg Oral QID Durward Parcel, MD   0.1 mg at 04/07/14 5102   Followed by  . [START ON 04/09/2014] cloNIDine (CATAPRES) tablet 0.1 mg  0.1 mg Oral BH-qamhs Durward Parcel, MD       Followed by  . [START ON 04/11/2014] cloNIDine (CATAPRES) tablet 0.1 mg  0.1 mg Oral QAC breakfast Durward Parcel, MD      . dicyclomine (BENTYL) tablet 20 mg  20 mg Oral Q6H PRN Durward Parcel, MD      . enoxaparin (LOVENOX) injection 40 mg  40 mg Subcutaneous Q24H Theressa Millard, MD   40 mg at 04/07/14 5852  . hydrOXYzine (ATARAX/VISTARIL) tablet 25 mg  25 mg Oral Q6H PRN Durward Parcel, MD      . loperamide (IMODIUM) capsule 2-4 mg  2-4 mg Oral PRN Durward Parcel, MD      . methocarbamol (ROBAXIN) tablet 500 mg  500 mg Oral Q8H PRN Durward Parcel, MD      . multivitamin with minerals tablet 1 tablet  1 tablet Oral Daily Durward Parcel, MD   1 tablet at 04/07/14 7782  . naproxen (NAPROSYN) tablet 500 mg  500 mg Oral BID PRN Durward Parcel, MD      .  ondansetron (ZOFRAN) tablet 4 mg  4 mg Oral Q6H PRN Theressa Millard, MD       Or  . ondansetron (ZOFRAN) injection 4 mg  4 mg Intravenous Q6H PRN Theressa Millard, MD      . ondansetron (ZOFRAN-ODT) disintegrating tablet 4 mg  4 mg Oral Q6H PRN Durward Parcel, MD      . pantoprazole (PROTONIX) injection 40 mg  40 mg Intravenous Q24H Theressa Millard, MD   40 mg at 04/06/14 2338  . thiamine (VITAMIN B-1) tablet 100 mg  100 mg Oral Daily Durward Parcel, MD   100 mg at 04/07/14 4235    Psychiatric Specialty Exam: Physical Exam  ROS  Blood pressure 127/74, pulse 55, temperature 97.6 F (36.4 C), temperature source Oral, resp. rate 19, height $RemoveBe'5\' 10"'aGlWLZnPc$  (1.778 m), weight 66.6 kg (146 lb 13.2 oz), SpO2 99.00%.Body mass index is 21.07 kg/(m^2).  General Appearance: Casual  Eye Contact::  Good  Speech:  Clear and Coherent  Volume:  Normal  Mood:  Anxious and Depressed  Affect:  Appropriate and Congruent  Thought Process:  Coherent and Goal Directed  Orientation:  Full (Time, Place, and Person)  Thought Content:  WDL  Suicidal Thoughts:  No  Homicidal Thoughts:  No  Memory:  Immediate;   Fair Recent;   Fair  Judgement:  Intact  Insight:  Lacking  Psychomotor Activity:  Psychomotor Retardation and Restlessness  Concentration:  Fair  Recall:  Good  Fund of Knowledge:Good  Language: Good  Akathisia:  NA  Handed:  Right  AIMS (if indicated):     Assets:  Communication Skills Desire for Improvement Housing Intimacy Leisure Time Physical Health Resilience Social Support Talents/Skills  Sleep:      Musculoskeletal: Strength & Muscle Tone: within normal limits Gait & Station: normal Patient leans: N/A  Treatment Plan Summary: Daily contact with patient to assess and evaluate symptoms and progress in treatment Medication management  Adaleigh Warf,JANARDHAHA R. 04/07/2014 10:15 AM

## 2014-04-07 NOTE — Clinical Social Work Psych Note (Signed)
Psych CSW now following.  Pt disposition: inpatient psychiatric hospitalization  Lynden OxfordGina Aryanah Enslow, LCSWA [phone]  Clinical Social Work

## 2014-04-07 NOTE — Progress Notes (Signed)
NURSING PROGRESS NOTE  [facility]aleb [facility] [id number] Transfer Data: 04/07/2014 6:26 PM Attending Provider: Maretta BeesShanker M Ghimire, MD PCP:No PCP Per Patient Code Status: FUll  Caleb Bush is a 33 y.o. male patient transferred from 2c -No acute distress noted.  -No complaints of shortness of breath.  -No complaints of chest pain.    Blood pressure 128/63, pulse 50, temperature 97.8 F (36.6 C), temperature source Oral, resp. rate 18, height 5\' 10"  (1.778 m), weight 66.6 kg (146 lb 13.2 oz), SpO2 100.00%.   IV Fluids:  Left F/A, NS at 450ml/hour.  Allergies:  Ivp dye and Penicillins  Past Medical History:   has a past medical history of Hepatitis C and Substance abuse.  Past Surgical History:   has no past surgical history on file.  Social History:   reports that he has been smoking Cigarettes.  He has been smoking about 0.50 packs per day. He does not have any smokeless tobacco history on file. He reports that he does not drink alcohol or use illicit drugs.  Skin: Intact  Patient/Family orientated to room. Information packet given to patient/family. Admission inpatient armband information verified with patient/family to include name and date of birth and placed on patient arm. Side rails up x 2, fall assessment and education completed with patient/family. Patient/family able to verbalize understanding of risk associated with falls and verbalized understanding to call for assistance before getting out of bed. Call light within reach. Patient/family able to voice and demonstrate understanding of unit orientation instructions.    Will continue to evaluate and treat per MD orders.

## 2014-04-07 NOTE — Progress Notes (Signed)
Craige CottaKirby, NP  notified of pt's bp and pulse rate(117/69 and 54/min). As per order it's okay to give med at this time and recheck bp in an hour and notify if it's l<90. Will monitor.

## 2014-04-08 MED ORDER — CHLORDIAZEPOXIDE HCL 25 MG PO CAPS: 25.0000 mg | ORAL_CAPSULE | Freq: Three times a day (TID) | ORAL | Status: AC

## 2014-04-08 NOTE — Clinical Documentation Improvement (Signed)
Possible Clinical Conditions?  Encephalopathy (describe type if known)                       Anoxic                       Alcoholic                        Hepatic                       Hypertensive                       Metabolic                       Toxic Drug induced delirium Poisoning / Overdose Other Condition Cannot Clinically Determine   Supporting Information: Risk Factors: ( As per notes) " Heroin Overdose" Signs & Symptoms:" Obtunded", "Altered Mental Status" Treatment:  chlordiazePOXIDE (LIBRIUM) capsule 25 mg [[id number]][[id number]]     Ordered Dose: 25 mg Route: Oral Frequency: Every 6 hours PRN for withdrawal, CIWA score > 10   Thank You, Nevin BloodgoodJoan B Suhaib Guzzo ,RN, BSN, CCDS,Clinical Documentation Specialist:  [phone]  [phone]=[facility]- Health Information Management

## 2014-04-08 NOTE — Clinical Social Work Psych Note (Signed)
Psych CSW met with pt at bedside to review disposition.  Pt is to dc home with mom. Pt has an appointment with Chinita Pester Merry Proud) on Monday at [facility] stressed that pt will need to remain sober prior to appointment.  Pt acknowledged understanding.  If pt were to use prior to the appointment on Monday, pt is instructed to continue with the appointment in hopes of reserving the bed after necessary detox.  Pt acknowledged understanding.  Pt states he is ready for change.  Pt reports having 20 months (2009) and 11 months (2014) of sobriety.  CSW provided psychoeducation and support to pt.  Pt was agreeable to f/u and expressed excitement regarding the reservation of his tx bed on Monday.  Pt to dc with mom.  Mom is approx 45 minutes away. Pt will need to use RN phone to arrange transportation.  RN agreeable.   Psych CSW updated RN and MD.    Psych CSW signing off.  Nonnie Done, [place] [phone]  Clinical Social Work

## 2014-04-08 NOTE — Discharge Summary (Signed)
Pt seen and examined. Agree with above assessment and plan by Ms. Easterwood, NP. Briefly, pt presents with polysubstance abuse with AMS. Pt has improved since admission. Pt to follow up with behavioral health on discharge home. Stable for discharge today

## 2014-04-08 NOTE — Progress Notes (Signed)
Nsg Discharge Note  Admit Date:  04/05/2014 Discharge date: 04/08/2014   Caleb Bush to be D/C'd Home per MD order.  AVS completed.  Copy for chart, and copy for patient signed, and dated. Patient/caregiver able to verbalize understanding.  Discharge Medication:   Medication List         chlordiazePOXIDE 25 MG capsule  Commonly known as:  LIBRIUM  Take 1 capsule (25 mg total) by mouth 3 (three) times daily. Take 1 capsule (25mg ) by mouth 3 times daily        Discharge Assessment: Filed Vitals:   04/08/14 1100  BP: 114/65  Pulse: 47  Temp: 97.5 F (36.4 C)  Resp: 16   Abrasions to Right arm noted, and covered with foam. IV catheter discontinued intact. Site without signs and symptoms of complications - no redness or edema noted at insertion site, patient denies c/o pain - only slight tenderness at site.  Dressing with slight pressure applied.  D/c Instructions-Education: Discharge instructions given to patient/family with verbalized understanding. D/c education completed with patient/family including follow up instructions, medication list, d/c activities limitations if indicated, with other d/c instructions as indicated by MD - patient able to verbalize understanding, all questions fully answered. Patient instructed to return to ED, call 911, or call MD for any changes in condition.   Kern ReapBrumagin, Karmine Kauer L, RN 04/08/2014 1:24 PM

## 2014-04-08 NOTE — Discharge Summary (Signed)
Physician Discharge Summary  Caleb Bush ZOX:[id number] DOB: [birth year] DOA: 04/05/2014  PCP: No PCP Per Patient  Admit date: 04/05/2014 Discharge date: 04/08/2014  Time spent: 40 minutes  Recommendations for Outpatient Follow-up:  1. [facility] on Monday 04/11/14 at 8am   Discharge Diagnoses:  Active Problems:   Heroin abuse   Polysubstance abuse   Altered mental status   Hepatitis, chronic   Acute respiratory failure with hypoxia   Discharge Condition: medically stable  Diet recommendation: regular  Filed Weights   04/06/14 0115 04/06/14 0418  Weight: 66.6 kg (146 lb 13.2 oz) 66.6 kg (146 lb 13.2 oz)    History of present illness:       33 y.o. male who was referred to the ED from [facility] where he had presented for Detox and rehab therapy. Upon arrival was noted to be very lethargic and had endorsed heroin use 30 minutes prior to arrival to their center so was denied admission due to inability to be medically cleared.       He was evaluated in the ED at [facility] where a UDS was performed which returned positive for Opiates, Cocaine, Benzodiazepines, and THC. Despite monitoring in the ER his lethargy persisted and he at times was obtunded. He was not given any reversal agents. He remained hemodynamically stable. When he was awake he apparently endorsed a 2 gm per day heroin habit.   Hospital Course:   Heroin abuse  -clearly improved than on admission -he is medically stable for d/c and to [facility] -he refuses methadone tx. He will be given Lithium at discharge -Psych consulted this admit  Acute respiratory failure with hypoxia  -due to AMS and related hypoventilation-now resolved   Altered mental status  -due to acute intoxication from narcs and BZDs  -resolved at discharge  Hepatitis, chronic  -LFTs are normal   Polysubstance abuse  -UDS positive for opiates, cocaine, THC and  BZDs   Procedures:  none  Consultations:  psychiatry  Discharge Exam: Filed Vitals:   04/08/14 0519 04/08/14 0620 04/08/14 0830 04/08/14 1100  BP: 120/76 120/70 125/73 114/65  Pulse: 45 54 50 47  Temp: 97.8 F (36.6 C)  98 F (36.7 C) 97.5 F (36.4 C)  TempSrc: Oral  Oral Oral  Resp: 16 18 16 16   Height:      Weight:      SpO2: 98% 99% 99% 98%    General: awake, alert in NAD Cardiovascular: S1S2 RRR, no m/r/g Respiratory: CTAB, no w/r/c GI: abdomen soft, NT/ND, BS+ Psych: aox3, pleasant   Discharge Instructions You were cared for by a hospitalist during your hospital stay. If you have any questions about your discharge medications or the care you received while you were in the hospital after you are discharged, you can call the unit and asked to speak with the hospitalist on call if the hospitalist that took care of you is not available. Once you are discharged, your primary care physician will handle any further medical issues. Please note that NO REFILLS for any discharge medications will be authorized once you are discharged, as it is imperative that you return to your primary care physician (or establish a relationship with a primary care physician if you do not have one) for your aftercare needs so that they can reassess your need for medications and monitor your lab values.     Medication List  chlordiazePOXIDE 25 MG capsule  Commonly known as:  LIBRIUM  Take 1 capsule (25 mg total) by mouth 3 (three) times daily. Take 1 capsule (25mg ) by mouth 3 times daily       Allergies  Allergen Reactions  . Ivp Dye [Iodinated Diagnostic Agents]     Unknown  . Penicillins Hives       Follow-up Information   Follow up with [facility] On 04/11/2014. ([facility] treatment admissions appointment on Monday 7/13 at 8am)        The results of significant diagnostics from this hospitalization (including imaging, microbiology, ancillary and laboratory) are  listed below for reference.    Significant Diagnostic Studies: No results found.  Microbiology: Recent Results (from the past 240 hour(s))  MRSA PCR SCREENING     Status: None   Collection Time    04/06/14  1:20 AM      Result Value Ref Range Status   MRSA by PCR NEGATIVE  NEGATIVE Final   Comment:            The GeneXpert MRSA Assay (FDA     approved for NASAL specimens     only), is one component of a     comprehensive MRSA colonization     surveillance program. It is not     intended to diagnose MRSA     infection nor to guide or     monitor treatment for     MRSA infections.     Labs: Basic Metabolic Panel:  Recent Labs Lab 04/05/14 1847 04/06/14 0504  NA 140 141  K 4.7 4.0  CL 101 104  CO2 27 24  GLUCOSE 100* 97  BUN 11 9  CREATININE 0.92 0.86  CALCIUM 9.5 8.8   Liver Function Tests:  Recent Labs Lab 04/05/14 1847  AST 31  ALT 23  ALKPHOS 94  BILITOT 0.4  PROT 7.9  ALBUMIN 4.1   No results found for this basename: LIPASE, AMYLASE,  in the last 168 hours No results found for this basename: AMMONIA,  in the last 168 hours CBC:  Recent Labs Lab 04/05/14 1847 04/06/14 0504  WBC 8.7 6.9  HGB 14.4 13.2  HCT 43.7 40.5  MCV 90.5 89.8  PLT 232 203   Cardiac Enzymes: No results found for this basename: CKTOTAL, CKMB, CKMBINDEX, TROPONINI,  in the last 168 hours BNP: BNP (last 3 results) No results found for this basename: PROBNP,  in the last 8760 hours CBG: No results found for this basename: GLUCAP,  in the last 168 hours     Signed:  Cordelia Pen, NP-C [facility]  pgr [phone]

## 2014-04-08 NOTE — Care Management Note (Signed)
    Page 1 of 1   04/08/2014     12:32:37 PM CARE MANAGEMENT NOTE 04/08/2014  Patient:  Caleb Bush,Caleb Bush   Account Number:  [id number]  Date Initiated:  04/08/2014  Documentation initiated by:  Letha CapeAYLOR,Telesa Jeancharles  Subjective/Objective Assessment:   dx psa, witddraws  admit- homeless.     Action/Plan:   Anticipated DC Date:  04/08/2014   Anticipated DC Plan:  PSYCHIATRIC HOSPITAL  In-house referral  Clinical Social Worker      DC Planning Services  CM consult      Choice offered to / List presented to:             Status of service:  Completed, signed off Medicare Important Message given?   (If response is "NO", the following Medicare IM given date fields will be blank) Date Medicare IM given:   Medicare IM given by:   Date Additional Medicare IM given:   Additional Medicare IM given by:    Discharge Disposition:  PSYCHIATRIC HOSPITAL  Per UR Regulation:  Reviewed for med. necessity/level of care/duration of stay  If discussed at Long Length of Stay Meetings, dates discussed:    Comments:  04/08/14 1230 Letha Capeeborah Olanda Downie RN, BSN [phone] patient is for dc today [facility],  patient is only on librium [facility] [employer].  NCM will inform patient.   [facility]

## 2016-01-23 ENCOUNTER — Emergency Department (HOSPITAL_BASED_OUTPATIENT_CLINIC_OR_DEPARTMENT_OTHER): Payer: No Typology Code available for payment source

## 2016-01-23 ENCOUNTER — Emergency Department (HOSPITAL_BASED_OUTPATIENT_CLINIC_OR_DEPARTMENT_OTHER)
Admission: EM | Admit: 2016-01-23 | Discharge: 2016-01-23 | Disposition: A | Discharge status: Home / Self Care | Payer: No Typology Code available for payment source | Attending: Emergency Medicine | Admitting: Emergency Medicine

## 2016-01-23 ENCOUNTER — Encounter (HOSPITAL_BASED_OUTPATIENT_CLINIC_OR_DEPARTMENT_OTHER): Payer: Self-pay | Admitting: *Deleted

## 2016-01-23 DIAGNOSIS — R0789 Other chest pain: Secondary | ICD-10-CM

## 2016-01-23 DIAGNOSIS — F17210 Nicotine dependence, cigarettes, uncomplicated: Secondary | ICD-10-CM | POA: Insufficient documentation

## 2016-01-23 DIAGNOSIS — Y9389 Activity, other specified: Secondary | ICD-10-CM | POA: Diagnosis not present

## 2016-01-23 DIAGNOSIS — Y92410 Unspecified street and highway as the place of occurrence of the external cause: Secondary | ICD-10-CM | POA: Insufficient documentation

## 2016-01-23 DIAGNOSIS — S29001A Unspecified injury of muscle and tendon of front wall of thorax, initial encounter: Secondary | ICD-10-CM | POA: Insufficient documentation

## 2016-01-23 DIAGNOSIS — Z880 Allergy status to penicillin: Secondary | ICD-10-CM | POA: Diagnosis not present

## 2016-01-23 DIAGNOSIS — Z8619 Personal history of other infectious and parasitic diseases: Secondary | ICD-10-CM | POA: Insufficient documentation

## 2016-01-23 DIAGNOSIS — Y998 Other external cause status: Secondary | ICD-10-CM | POA: Insufficient documentation

## 2016-01-23 MED ORDER — NAPROXEN 500 MG PO TABS: 500.0000 mg | ORAL_TABLET | Freq: Two times a day (BID) | ORAL | Status: DC
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
## 2016-01-23 NOTE — Discharge Instructions (Signed)

## 2017-07-17 ENCOUNTER — Observation Stay (HOSPITAL_COMMUNITY)

## 2017-07-17 ENCOUNTER — Observation Stay (HOSPITAL_COMMUNITY): Admission: EM | Admit: 2017-07-17 | Discharge: 2017-07-19 | Attending: Oral Surgery | Admitting: Oral Surgery

## 2017-07-17 ENCOUNTER — Emergency Department (HOSPITAL_COMMUNITY)

## 2017-07-17 ENCOUNTER — Encounter (HOSPITAL_COMMUNITY): Payer: Self-pay | Admitting: Emergency Medicine

## 2017-07-17 DIAGNOSIS — B182 Chronic viral hepatitis C: Secondary | ICD-10-CM | POA: Insufficient documentation

## 2017-07-17 DIAGNOSIS — S02602B Fracture of unspecified part of body of left mandible, initial encounter for open fracture: Secondary | ICD-10-CM | POA: Diagnosis not present

## 2017-07-17 DIAGNOSIS — Z791 Long term (current) use of non-steroidal anti-inflammatories (NSAID): Secondary | ICD-10-CM | POA: Diagnosis not present

## 2017-07-17 DIAGNOSIS — F17210 Nicotine dependence, cigarettes, uncomplicated: Secondary | ICD-10-CM | POA: Insufficient documentation

## 2017-07-17 DIAGNOSIS — S0101XA Laceration without foreign body of scalp, initial encounter: Secondary | ICD-10-CM | POA: Insufficient documentation

## 2017-07-17 DIAGNOSIS — R6884 Jaw pain: Secondary | ICD-10-CM | POA: Diagnosis present

## 2017-07-17 DIAGNOSIS — K083 Retained dental root: Secondary | ICD-10-CM | POA: Insufficient documentation

## 2017-07-17 DIAGNOSIS — S02609B Fracture of mandible, unspecified, initial encounter for open fracture: Secondary | ICD-10-CM | POA: Diagnosis present

## 2017-07-17 DIAGNOSIS — S02641A Fracture of ramus of right mandible, initial encounter for closed fracture: Secondary | ICD-10-CM | POA: Diagnosis not present

## 2017-07-17 DIAGNOSIS — S0269XB Fracture of mandible of other specified site, initial encounter for open fracture: Secondary | ICD-10-CM

## 2017-07-17 DIAGNOSIS — S02609A Fracture of mandible, unspecified, initial encounter for closed fracture: Secondary | ICD-10-CM

## 2017-07-17 DIAGNOSIS — Y92149 Unspecified place in prison as the place of occurrence of the external cause: Secondary | ICD-10-CM | POA: Diagnosis not present

## 2017-07-17 DIAGNOSIS — K029 Dental caries, unspecified: Secondary | ICD-10-CM | POA: Diagnosis not present

## 2017-07-17 DIAGNOSIS — Z8781 Personal history of (healed) traumatic fracture: Secondary | ICD-10-CM

## 2017-07-17 LAB — CBC
HCT: 43.5 % (ref 39.0–52.0)
HEMOGLOBIN: 14.7 g/dL (ref 13.0–17.0)
MCH: 29.8 pg (ref 26.0–34.0)
MCHC: 33.8 g/dL (ref 30.0–36.0)
MCV: 88.1 fL (ref 78.0–100.0)
PLATELETS: 181 10*3/uL (ref 150–400)
RBC: 4.94 MIL/uL (ref 4.22–5.81)
RDW: 13.3 % (ref 11.5–15.5)
WBC: 15.6 10*3/uL — AB (ref 4.0–10.5)

## 2017-07-17 LAB — BASIC METABOLIC PANEL
Anion gap: 8 (ref 5–15)
BUN: 12 mg/dL (ref 6–20)
CHLORIDE: 108 mmol/L (ref 101–111)
CO2: 21 mmol/L — ABNORMAL LOW (ref 22–32)
Calcium: 9 mg/dL (ref 8.9–10.3)
Creatinine, Ser: 0.81 mg/dL (ref 0.61–1.24)
Glucose, Bld: 130 mg/dL — ABNORMAL HIGH (ref 65–99)
Potassium: 3.7 mmol/L (ref 3.5–5.1)
SODIUM: 137 mmol/L (ref 135–145)

## 2017-07-17 MED ORDER — CLINDAMYCIN PHOSPHATE 300 MG/50ML IV SOLN
300.0000 mg | Freq: Four times a day (QID) | INTRAVENOUS | Status: DC
  Administered 2017-07-18 (×3): 300 mg via INTRAVENOUS
  Filled 2017-07-17 (×5): qty 50

## 2017-07-17 MED ORDER — ONDANSETRON 4 MG PO TBDP: 4.0000 mg | ORAL_TABLET | Freq: Four times a day (QID) | ORAL | Status: DC | PRN

## 2017-07-17 MED ORDER — FENTANYL CITRATE (PF) 100 MCG/2ML IJ SOLN
50.0000 ug | Freq: Once | INTRAMUSCULAR | Status: AC
  Administered 2017-07-17: 50 ug via INTRAVENOUS
  Filled 2017-07-17: qty 2

## 2017-07-17 MED ORDER — ONDANSETRON HCL 4 MG/2ML IJ SOLN: 4.0000 mg | Freq: Four times a day (QID) | INTRAMUSCULAR | Status: DC | PRN

## 2017-07-17 MED ORDER — CLINDAMYCIN PHOSPHATE 600 MG/50ML IV SOLN
600.0000 mg | Freq: Once | INTRAVENOUS | Status: AC
  Administered 2017-07-17: 600 mg via INTRAVENOUS
  Filled 2017-07-17: qty 50

## 2017-07-17 MED ORDER — LIDOCAINE-EPINEPHRINE (PF) 2 %-1:200000 IJ SOLN
10.0000 mL | Freq: Once | INTRAMUSCULAR | Status: AC
  Administered 2017-07-17: 10 mL
  Filled 2017-07-17: qty 20

## 2017-07-17 MED ORDER — KETOROLAC TROMETHAMINE 30 MG/ML IJ SOLN
30.0000 mg | Freq: Four times a day (QID) | INTRAMUSCULAR | Status: DC | PRN
  Administered 2017-07-17 – 2017-07-18 (×3): 30 mg via INTRAVENOUS
  Filled 2017-07-17 (×3): qty 1

## 2017-07-17 MED ORDER — PANTOPRAZOLE SODIUM 40 MG IV SOLR
40.0000 mg | Freq: Every day | INTRAVENOUS | Status: DC
  Administered 2017-07-18: 40 mg via INTRAVENOUS
  Filled 2017-07-17: qty 40

## 2017-07-17 MED ORDER — MORPHINE SULFATE (PF) 4 MG/ML IV SOLN
2.0000 mg | INTRAVENOUS | Status: DC | PRN
  Administered 2017-07-17 – 2017-07-18 (×4): 2 mg via INTRAVENOUS
  Filled 2017-07-17 (×5): qty 1

## 2017-07-17 MED ORDER — SODIUM CHLORIDE 0.9 % IV SOLN
INTRAVENOUS | Status: DC
  Administered 2017-07-17 – 2017-07-18 (×2): via INTRAVENOUS
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
## 2017-07-17 NOTE — ED Notes (Signed)
Maxillofacial provider at bedside
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
## 2017-07-18 ENCOUNTER — Encounter (HOSPITAL_COMMUNITY): Admission: EM | Payer: Self-pay | Source: Home / Self Care | Attending: Emergency Medicine

## 2017-07-18 ENCOUNTER — Observation Stay (HOSPITAL_COMMUNITY): Admitting: Certified Registered Nurse Anesthetist

## 2017-07-18 ENCOUNTER — Encounter (HOSPITAL_COMMUNITY): Payer: Self-pay | Admitting: Orthopedic Surgery

## 2017-07-18 DIAGNOSIS — K083 Retained dental root: Secondary | ICD-10-CM | POA: Diagnosis not present

## 2017-07-18 DIAGNOSIS — S02609A Fracture of mandible, unspecified, initial encounter for closed fracture: Secondary | ICD-10-CM

## 2017-07-18 DIAGNOSIS — S02641A Fracture of ramus of right mandible, initial encounter for closed fracture: Secondary | ICD-10-CM | POA: Diagnosis not present

## 2017-07-18 DIAGNOSIS — K029 Dental caries, unspecified: Secondary | ICD-10-CM | POA: Diagnosis not present

## 2017-07-18 DIAGNOSIS — S02602B Fracture of unspecified part of body of left mandible, initial encounter for open fracture: Secondary | ICD-10-CM | POA: Diagnosis not present

## 2017-07-18 HISTORY — DX: Fracture of mandible, unspecified, initial encounter for closed fracture: S02.609A

## 2017-07-18 HISTORY — PX: MULTIPLE EXTRACTIONS WITH ALVEOLOPLASTY: SHX5342

## 2017-07-18 HISTORY — PX: ORIF MANDIBULAR FRACTURE: SHX2127

## 2017-07-18 HISTORY — PX: CLOSED REDUCTION MANDIBLE: SHX5307

## 2017-07-18 LAB — SURGICAL PCR SCREEN
MRSA, PCR: NEGATIVE
Staphylococcus aureus: NEGATIVE

## 2017-07-18 SURGERY — OPEN REDUCTION INTERNAL FIXATION (ORIF) MANDIBULAR FRACTURE
Anesthesia: General | Site: Mouth | Laterality: Right

## 2017-07-18 MED ORDER — PANTOPRAZOLE SODIUM 40 MG PO TBEC
40.0000 mg | DELAYED_RELEASE_TABLET | Freq: Every day | ORAL | Status: DC
  Administered 2017-07-19: 40 mg via ORAL
  Filled 2017-07-18: qty 1

## 2017-07-18 MED ORDER — PROPOFOL 10 MG/ML IV BOLUS
INTRAVENOUS | Status: AC
  Filled 2017-07-18: qty 20

## 2017-07-18 MED ORDER — HYDROMORPHONE HCL 1 MG/ML IJ SOLN
0.2500 mg | INTRAMUSCULAR | Status: DC | PRN
  Administered 2017-07-18: 0.5 mg via INTRAVENOUS

## 2017-07-18 MED ORDER — LIDOCAINE HCL (CARDIAC) 20 MG/ML IV SOLN
INTRAVENOUS | Status: DC | PRN
  Administered 2017-07-18: 100 mg via INTRAVENOUS

## 2017-07-18 MED ORDER — MORPHINE SULFATE (PF) 4 MG/ML IV SOLN
2.0000 mg | INTRAVENOUS | Status: DC | PRN
  Administered 2017-07-18 – 2017-07-19 (×3): 2 mg via INTRAVENOUS
  Filled 2017-07-18 (×4): qty 1

## 2017-07-18 MED ORDER — OXYMETAZOLINE HCL 0.05 % NA SOLN
NASAL | Status: DC | PRN
  Administered 2017-07-18: 2 via NASAL

## 2017-07-18 MED ORDER — EPHEDRINE 5 MG/ML INJ
INTRAVENOUS | Status: AC
  Filled 2017-07-18: qty 10

## 2017-07-18 MED ORDER — PHENYLEPHRINE HCL 10 MG/ML IJ SOLN
INTRAMUSCULAR | Status: DC | PRN
  Administered 2017-07-18: 80 ug via INTRAVENOUS
  Administered 2017-07-18 (×2): 40 ug via INTRAVENOUS

## 2017-07-18 MED ORDER — ROCURONIUM BROMIDE 10 MG/ML (PF) SYRINGE
PREFILLED_SYRINGE | INTRAVENOUS | Status: AC
  Filled 2017-07-18: qty 5

## 2017-07-18 MED ORDER — NEOSTIGMINE METHYLSULFATE 10 MG/10ML IV SOLN
INTRAVENOUS | Status: DC | PRN
  Administered 2017-07-18: 3 mg via INTRAVENOUS

## 2017-07-18 MED ORDER — HYDROMORPHONE HCL 1 MG/ML IJ SOLN
INTRAMUSCULAR | Status: AC
  Filled 2017-07-18: qty 1

## 2017-07-18 MED ORDER — IBUPROFEN 100 MG/5ML PO SUSP
600.0000 mg | Freq: Four times a day (QID) | ORAL | Status: DC
  Administered 2017-07-18 – 2017-07-19 (×3): 600 mg via ORAL
  Filled 2017-07-18 (×5): qty 30

## 2017-07-18 MED ORDER — FENTANYL CITRATE (PF) 100 MCG/2ML IJ SOLN
INTRAMUSCULAR | Status: DC | PRN
Start: 2017-07-18 — End: 2017-07-18
  Administered 2017-07-18 (×4): 50 ug via INTRAVENOUS
  Administered 2017-07-18: 100 ug via INTRAVENOUS
  Administered 2017-07-18 (×2): 50 ug via INTRAVENOUS

## 2017-07-18 MED ORDER — PHENYLEPHRINE 40 MCG/ML (10ML) SYRINGE FOR IV PUSH (FOR BLOOD PRESSURE SUPPORT)
PREFILLED_SYRINGE | INTRAVENOUS | Status: AC
  Filled 2017-07-18: qty 10

## 2017-07-18 MED ORDER — OXYCODONE HCL 5 MG PO TABS: 5.0000 mg | ORAL_TABLET | Freq: Once | ORAL | Status: DC | PRN

## 2017-07-18 MED ORDER — HYDROCODONE-ACETAMINOPHEN 7.5-325 MG/15ML PO SOLN
15.0000 mL | ORAL | Status: DC | PRN
  Administered 2017-07-18 – 2017-07-19 (×2): 15 mL via ORAL
  Filled 2017-07-18 (×3): qty 15

## 2017-07-18 MED ORDER — SUCCINYLCHOLINE CHLORIDE 20 MG/ML IJ SOLN
INTRAMUSCULAR | Status: DC | PRN
  Administered 2017-07-18: 120 mg via INTRAVENOUS

## 2017-07-18 MED ORDER — OXYMETAZOLINE HCL 0.05 % NA SOLN
NASAL | Status: AC
  Filled 2017-07-18: qty 15

## 2017-07-18 MED ORDER — ONDANSETRON HCL 4 MG/2ML IJ SOLN
INTRAMUSCULAR | Status: DC | PRN
  Administered 2017-07-18: 4 mg via INTRAVENOUS

## 2017-07-18 MED ORDER — LIDOCAINE-EPINEPHRINE 1 %-1:100000 IJ SOLN
INTRAMUSCULAR | Status: AC
  Filled 2017-07-18: qty 1

## 2017-07-18 MED ORDER — ONDANSETRON 4 MG PO TBDP: 4.0000 mg | ORAL_TABLET | Freq: Four times a day (QID) | ORAL | Status: DC | PRN

## 2017-07-18 MED ORDER — GELATIN ADSORBABLE OP FILM
ORAL_FILM | OPHTHALMIC | Status: AC
  Filled 2017-07-18: qty 1

## 2017-07-18 MED ORDER — 0.9 % SODIUM CHLORIDE (POUR BTL) OPTIME
TOPICAL | Status: DC | PRN
  Administered 2017-07-18: 1000 mL

## 2017-07-18 MED ORDER — LIDOCAINE-EPINEPHRINE 1 %-1:100000 IJ SOLN
INTRAMUSCULAR | Status: DC | PRN
  Administered 2017-07-18: 20 mL

## 2017-07-18 MED ORDER — SUGAMMADEX SODIUM 200 MG/2ML IV SOLN
INTRAVENOUS | Status: AC
  Filled 2017-07-18: qty 2

## 2017-07-18 MED ORDER — CHLORHEXIDINE GLUCONATE 0.12 % MT SOLN
15.0000 mL | Freq: Three times a day (TID) | OROMUCOSAL | Status: DC
  Administered 2017-07-19 (×2): 15 mL via OROMUCOSAL
  Filled 2017-07-18 (×3): qty 15

## 2017-07-18 MED ORDER — LACTATED RINGERS IV SOLN
INTRAVENOUS | Status: DC
  Administered 2017-07-18: 12:00:00 via INTRAVENOUS

## 2017-07-18 MED ORDER — PROPOFOL 10 MG/ML IV BOLUS
INTRAVENOUS | Status: DC | PRN
  Administered 2017-07-18: 200 mg via INTRAVENOUS

## 2017-07-18 MED ORDER — ROCURONIUM BROMIDE 100 MG/10ML IV SOLN
INTRAVENOUS | Status: DC | PRN
  Administered 2017-07-18: 50 mg via INTRAVENOUS

## 2017-07-18 MED ORDER — MIDAZOLAM HCL 5 MG/5ML IJ SOLN
INTRAMUSCULAR | Status: DC | PRN
  Administered 2017-07-18: 2 mg via INTRAVENOUS

## 2017-07-18 MED ORDER — FENTANYL CITRATE (PF) 250 MCG/5ML IJ SOLN
INTRAMUSCULAR | Status: AC
  Filled 2017-07-18: qty 5

## 2017-07-18 MED ORDER — LIDOCAINE 2% (20 MG/ML) 5 ML SYRINGE
INTRAMUSCULAR | Status: AC
  Filled 2017-07-18: qty 5

## 2017-07-18 MED ORDER — DEXAMETHASONE SODIUM PHOSPHATE 10 MG/ML IJ SOLN
INTRAMUSCULAR | Status: AC
  Filled 2017-07-18: qty 1

## 2017-07-18 MED ORDER — BUPIVACAINE-EPINEPHRINE (PF) 0.25% -1:200000 IJ SOLN
INTRAMUSCULAR | Status: AC
  Filled 2017-07-18: qty 30

## 2017-07-18 MED ORDER — BACITRACIN ZINC 500 UNIT/GM EX OINT
TOPICAL_OINTMENT | CUTANEOUS | Status: AC
  Filled 2017-07-18: qty 28.35

## 2017-07-18 MED ORDER — ONDANSETRON HCL 4 MG/2ML IJ SOLN
INTRAMUSCULAR | Status: AC
  Filled 2017-07-18: qty 2

## 2017-07-18 MED ORDER — BUPIVACAINE-EPINEPHRINE (PF) 0.25% -1:200000 IJ SOLN
INTRAMUSCULAR | Status: DC | PRN
  Administered 2017-07-18: 20 mL

## 2017-07-18 MED ORDER — MIDAZOLAM HCL 2 MG/2ML IJ SOLN
INTRAMUSCULAR | Status: AC
  Filled 2017-07-18: qty 2

## 2017-07-18 MED ORDER — LACTATED RINGERS IV SOLN
INTRAVENOUS | Status: DC | PRN
  Administered 2017-07-18: 16:00:00 via INTRAVENOUS

## 2017-07-18 MED ORDER — OXYCODONE HCL 5 MG/5ML PO SOLN: 5.0000 mg | Freq: Once | ORAL | Status: DC | PRN

## 2017-07-18 MED ORDER — ONDANSETRON HCL 4 MG/2ML IJ SOLN: 4.0000 mg | Freq: Four times a day (QID) | INTRAMUSCULAR | Status: DC | PRN

## 2017-07-18 MED ORDER — SUCCINYLCHOLINE CHLORIDE 200 MG/10ML IV SOSY
PREFILLED_SYRINGE | INTRAVENOUS | Status: AC
  Filled 2017-07-18: qty 10

## 2017-07-18 MED ORDER — DEXMEDETOMIDINE HCL IN NACL 200 MCG/50ML IV SOLN
INTRAVENOUS | Status: DC | PRN
  Administered 2017-07-18 (×3): 12 ug via INTRAVENOUS

## 2017-07-18 MED ORDER — DEXAMETHASONE SODIUM PHOSPHATE 10 MG/ML IJ SOLN
INTRAMUSCULAR | Status: DC | PRN
  Administered 2017-07-18: 10 mg via INTRAVENOUS

## 2017-07-18 MED ORDER — CLINDAMYCIN PALMITATE HCL 75 MG/5ML PO SOLR
300.0000 mg | Freq: Four times a day (QID) | ORAL | Status: DC
  Administered 2017-07-18 – 2017-07-19 (×4): 300 mg via ORAL
  Filled 2017-07-18 (×5): qty 20

## 2017-07-18 SURGICAL SUPPLY — 34 items
BIT DRILL TWIST 1.6X58MM (BIT) ×3 IMPLANT
CANISTER SUCT 3000ML PPV (MISCELLANEOUS) ×5 IMPLANT
CLEANER TIP ELECTROSURG 2X2 (MISCELLANEOUS) ×5 IMPLANT
COVER SURGICAL LIGHT HANDLE (MISCELLANEOUS) ×5 IMPLANT
DRILL TWIST 1.6X58MM (BIT) ×5
ELECT NEEDLE BLADE 2-5/6 (NEEDLE) ×5 IMPLANT
ELECT REM PT RETURN 9FT ADLT (ELECTROSURGICAL) ×5
ELECTRODE REM PT RTRN 9FT ADLT (ELECTROSURGICAL) ×3 IMPLANT
GLOVE ORTHO TXT STRL SZ7.5 (GLOVE) ×5 IMPLANT
GOWN STRL REUS W/ TWL LRG LVL3 (GOWN DISPOSABLE) ×3 IMPLANT
GOWN STRL REUS W/TWL LRG LVL3 (GOWN DISPOSABLE) ×2
KIT BASIN OR (CUSTOM PROCEDURE TRAY) ×5 IMPLANT
KIT ROOM TURNOVER OR (KITS) ×5 IMPLANT
NEEDLE BLUNT 18X1 FOR OR ONLY (NEEDLE) ×5 IMPLANT
NEEDLE FILTER BLUNT 18X 1/2SAF (NEEDLE) ×2
NEEDLE FILTER BLUNT 18X1 1/2 (NEEDLE) ×3 IMPLANT
NEEDLE HYPO 25GX1X1/2 BEV (NEEDLE) ×5 IMPLANT
NS IRRIG 1000ML POUR BTL (IV SOLUTION) ×5 IMPLANT
PAD ARMBOARD 7.5X6 YLW CONV (MISCELLANEOUS) ×10 IMPLANT
PENCIL BUTTON HOLSTER BLD 10FT (ELECTRODE) ×5 IMPLANT
PLATE HYBRID MMF SM (Plate) ×10 IMPLANT
PLATE MNDBLE FRACTURE 4H (Plate) ×5 IMPLANT
SCISSORS WIRE DISP (INSTRUMENTS) ×5 IMPLANT
SCREW BONE CROSS PIN 2.0X10MM (Screw) ×10 IMPLANT
SCREW BONE CROSS PIN 2.0X12MM (Screw) ×5 IMPLANT
SCREW LOCK SELFDRIL 2.0X8M MMF (Screw) ×75 IMPLANT
STAPLER VISISTAT 35W (STAPLE) ×5 IMPLANT
SUT CHROMIC 3 0 PS 2 (SUTURE) ×5 IMPLANT
SUT VIC AB 4-0 P-3 18X BRD (SUTURE) ×6 IMPLANT
SUT VIC AB 4-0 P3 18 (SUTURE) ×4
SYRINGE 60CC LL (MISCELLANEOUS) ×5 IMPLANT
TOWEL OR 17X24 6PK STRL BLUE (TOWEL DISPOSABLE) ×5 IMPLANT
TRAY ENT MC OR (CUSTOM PROCEDURE TRAY) ×5 IMPLANT
YANKAUER SUCT BULB TIP NO VENT (SUCTIONS) ×5 IMPLANT

## 2017-07-18 NOTE — Transfer of Care (Signed)
Immediate Anesthesia Transfer of Care Note  Patient: Aldean Bakeraron Taylor Xxxabee  Procedure(s) Performed: OPEN REDUCTION INTERNAL FIXATION (ORIF) MANDIBULAR FRACTURE (Left Face) CLOSED REDUCTION CONDYLAR FRACTURE (Right Face) MULTIPLE EXTRACTION OF # 1, 14, 19, 30 & 31 (Mouth)  Patient Location: PACU  Anesthesia Type:General  Level of Consciousness: awake, alert , oriented and patient cooperative  Airway & Oxygen Therapy: Patient Spontanous Breathing  Post-op Assessment: Report given to RN and Post -op Vital signs reviewed and stable  Post vital signs: Reviewed and stable  Last Vitals:  Vitals:   07/18/17 0411 07/18/17 1118  BP: 130/75 (!) 128/106  Pulse: 67 67  Resp: 14   Temp: 36.6 C 36.7 C  SpO2: 100% 100%    Last Pain:  Vitals:   07/18/17 1118  TempSrc: Oral  PainSc:       Patients Stated Pain Goal: 2 (07/18/17 1107)  Complications: No apparent anesthesia complications

## 2017-07-18 NOTE — Progress Notes (Signed)
Spoke to a Physician from [facility] saying that they can't take him tonight, she will have to review his discharge summary , [facility] to [facility].
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
## 2017-07-18 NOTE — Brief Op Note (Signed)
07/17/2017 - 07/18/2017  4:08 PM  PATIENT:  Caleb BakerAaron Bush Xxxabee  36 y.o. male  PRE-OPERATIVE DIAGNOSIS:  Bilateral mandible fractures  POST-OPERATIVE DIAGNOSIS:  Bilateral mandible fractures  PROCEDURE:  1. ORIF left mandibular body fx 2. CR right mandibular subcondylar fx with maxillomandibular fixation 3. Placement of max/mand arch bars 4. Surgical Extraction of teeth #1,14,19,30,31  SURGEON:  Surgeon(s) and Role:    * Shade Rivenbark, DMD - Primary  ANESTHESIA:   general  EBL:  150 mL   BLOOD ADMINISTERED:none  DRAINS: none   LOCAL MEDICATIONS USED:  MARCAINE     SPECIMEN:  No Specimen  DISPOSITION OF SPECIMEN:  N/A  COUNTS:  YES  TOURNIQUET:  * No tourniquets in log *  DICTATION: .Dragon Dictation  PLAN OF CARE: discharge to [facility]  PATIENT DISPOSITION:  PACU - hemodynamically stable.   Delay start of Pharmacological VTE agent (>24hrs) due to surgical blood loss or risk of bleeding: not applicable
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
## 2017-07-18 NOTE — Anesthesia Postprocedure Evaluation (Signed)
Anesthesia Post Note  Patient: Caleb Bush  Procedure(s) Performed: OPEN REDUCTION INTERNAL FIXATION (ORIF) MANDIBULAR FRACTURE (Left Face) CLOSED REDUCTION CONDYLAR FRACTURE (Right Face) MULTIPLE EXTRACTION OF # 1, 14, 19, 30 & 31 (Bilateral Mouth)     Patient location during evaluation: PACU Anesthesia Type: General Level of consciousness: awake Pain management: pain level controlled Respiratory status: spontaneous breathing Cardiovascular status: stable Anesthetic complications: no    Last Vitals:  Vitals:   07/18/17 1118 07/18/17 1610  BP: (!) 128/106 (!) 142/74  Pulse: 67 99  Resp:  14  Temp: 36.7 C 36.5 C  SpO2: 100% 97%    Last Pain:  Vitals:   07/18/17 1118  TempSrc: Oral  PainSc:                  [name]
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
## 2017-07-18 NOTE — Progress Notes (Signed)
[facility], spoke to a nurse and I was told to let Dr. Kenney Housemanrab call [facility] physician before he can be transferred. Paged Dr. Kenney Housemanrab and made him aware about the process of transfer. Now awaiting call back from Dr. Kenney Housemanrab.

## 2017-07-18 NOTE — Anesthesia Procedure Notes (Signed)
Procedure Name: Intubation Date/Time: 07/18/2017 1:30 PM Performed by: Shirlyn Goltz Pre-anesthesia Checklist: Patient identified, Emergency Drugs available, Suction available and Patient being monitored Patient Re-evaluated:Patient Re-evaluated prior to induction Oxygen Delivery Method: Circle system utilized Preoxygenation: Pre-oxygenation with 100% oxygen Induction Type: IV induction Ventilation: Mask ventilation without difficulty Laryngoscope Size: Mac and 4 Grade View: Grade I Nasal Tubes: Right, Nasal Rae and Nasal prep performed Tube size: 7.0 mm Number of attempts: 1 Placement Confirmation: ETT inserted through vocal cords under direct vision,  positive ETCO2 and breath sounds checked- equal and bilateral Tube secured with: Tape Dental Injury: Teeth and Oropharynx as per pre-operative assessment
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
## 2017-07-19 ENCOUNTER — Observation Stay (HOSPITAL_COMMUNITY)

## 2017-07-19 NOTE — Discharge Instructions (Signed)
Patient Wired Biochemist, clinicalhut. Keep wire cutters at bedside. Regular liquid diet (high protein) until further notice Keep Head elevated >30 degrees for 2-3 days. May brush teeth/wires threes times daily with soft bristled toothbrush.  Medication Recommendations: 1. Chlorhexidine 0.12% rinse with 15mL tid until further notice 2. Ibuprofen elixir; 600mg  po q6h prn pain. 3. Lortab elixir; 15mL po q4h prn breakthrough pain. 4. Clindamycin elixir; 300mg  po tid x 10 days.
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
## 2017-07-19 NOTE — Progress Notes (Signed)
1 Day Post-Op   Subjective/Chief Complaint: Doing ok, no fevers/chills, no N/V; pain 2/10   Objective: Vital signs in last 24 hours: Temp:  [97.4 F (36.3 C)-98.1 F (36.7 C)] 97.4 F (36.3 C) (10/20 0433) Pulse Rate:  [56-99] 56 (10/20 0433) Resp:  [11-21] 18 (10/20 0433) BP: (117-153)/(66-106) 117/66 (10/20 0433) SpO2:  [92 %-100 %] 98 % (10/20 0433) Weight:  [77.1 kg (170 lb)] 77.1 kg (170 lb) (10/19 1221)    Intake/Output from previous day: 10/19 0701 - 10/20 0700 In: 2110 [P.O.:460; I.V.:1600; IV Piggyback:50] Out: 150 [Blood:150] Intake/Output this shift: No intake/output data recorded.  Physical Exam: Gen: resting in bed, HOB elevated HEENT: mild bilateral facial edema, occlusion stable in fixation, wounds intact/hemostatic Hrt: bradycardia, rr Lungs: clear Abd: s,nt,nd   Anti-infectives: Anti-infectives    Start     Dose/Rate Route Frequency Ordered Stop   07/18/17 1945  clindamycin (CLEOCIN) 75 MG/5ML solution 300 mg     300 mg Oral Every 6 hours 07/18/17 1932 07/25/17 1759   07/18/17 0000  clindamycin (CLEOCIN) IVPB 300 mg  Status:  Discontinued     300 mg 100 mL/hr over 30 Minutes Intravenous Every 6 hours 07/17/17 2328 07/18/17 1716   07/17/17 1600  clindamycin (CLEOCIN) IVPB 600 mg     600 mg 100 mL/hr over 30 Minutes Intravenous  Once 07/17/17 1600 07/17/17 1715      Assessment/Plan: s/p Procedure(s): OPEN REDUCTION INTERNAL FIXATION (ORIF) MANDIBULAR FRACTURE (Left) CLOSED REDUCTION CONDYLAR FRACTURE (Right) MULTIPLE EXTRACTION OF # 1, 14, 19, 30 & 31 (Bilateral) Progressing well - fixation stable, will obtain postop films Diet: continue full liquid diet with high protein supplements - tolerating liquids Swelling minimal - cont HOB elevation Pain: controlled with scheduled ibuprofen, Lortab prn, morphine prn breakthrough pain Prophy: scd's, protonix [facility]    LOS: 0 days    Jill AlexandersJustin Nadalie Laughner 07/19/2017
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
## 2017-07-21 ENCOUNTER — Encounter (HOSPITAL_COMMUNITY): Payer: Self-pay | Admitting: Oral Surgery

## 2017-09-09 ENCOUNTER — Encounter (HOSPITAL_BASED_OUTPATIENT_CLINIC_OR_DEPARTMENT_OTHER): Payer: Self-pay | Admitting: *Deleted

## 2017-09-09 ENCOUNTER — Other Ambulatory Visit: Payer: Self-pay

## 2017-09-11 ENCOUNTER — Ambulatory Visit: Payer: Self-pay | Admitting: Oral Surgery
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
## 2017-09-15 ENCOUNTER — Other Ambulatory Visit: Payer: Self-pay

## 2017-09-15 ENCOUNTER — Ambulatory Visit (HOSPITAL_BASED_OUTPATIENT_CLINIC_OR_DEPARTMENT_OTHER): Admitting: Anesthesiology

## 2017-09-15 ENCOUNTER — Encounter (HOSPITAL_BASED_OUTPATIENT_CLINIC_OR_DEPARTMENT_OTHER)
Admission: RE | Disposition: A | Discharge status: Home / Self Care | Payer: Self-pay | Source: Ambulatory Visit | Attending: Oral Surgery

## 2017-09-15 ENCOUNTER — Ambulatory Visit (HOSPITAL_BASED_OUTPATIENT_CLINIC_OR_DEPARTMENT_OTHER)
Admission: RE | Admit: 2017-09-15 | Discharge: 2017-09-15 | Disposition: A | Discharge status: Home / Self Care | Source: Ambulatory Visit | Attending: Oral Surgery | Admitting: Oral Surgery

## 2017-09-15 ENCOUNTER — Encounter (HOSPITAL_BASED_OUTPATIENT_CLINIC_OR_DEPARTMENT_OTHER): Payer: Self-pay | Admitting: *Deleted

## 2017-09-15 DIAGNOSIS — B1920 Unspecified viral hepatitis C without hepatic coma: Secondary | ICD-10-CM | POA: Insufficient documentation

## 2017-09-15 DIAGNOSIS — M899 Disorder of bone, unspecified: Secondary | ICD-10-CM | POA: Diagnosis not present

## 2017-09-15 DIAGNOSIS — S02621D Fracture of subcondylar process of right mandible, subsequent encounter for fracture with routine healing: Secondary | ICD-10-CM | POA: Insufficient documentation

## 2017-09-15 DIAGNOSIS — Z880 Allergy status to penicillin: Secondary | ICD-10-CM | POA: Diagnosis not present

## 2017-09-15 DIAGNOSIS — K219 Gastro-esophageal reflux disease without esophagitis: Secondary | ICD-10-CM | POA: Insufficient documentation

## 2017-09-15 DIAGNOSIS — S02609D Fracture of mandible, unspecified, subsequent encounter for fracture with routine healing: Secondary | ICD-10-CM | POA: Diagnosis present

## 2017-09-15 DIAGNOSIS — F1910 Other psychoactive substance abuse, uncomplicated: Secondary | ICD-10-CM | POA: Insufficient documentation

## 2017-09-15 DIAGNOSIS — Z91041 Radiographic dye allergy status: Secondary | ICD-10-CM | POA: Insufficient documentation

## 2017-09-15 DIAGNOSIS — F17210 Nicotine dependence, cigarettes, uncomplicated: Secondary | ICD-10-CM | POA: Insufficient documentation

## 2017-09-15 DIAGNOSIS — K1379 Other lesions of oral mucosa: Secondary | ICD-10-CM | POA: Insufficient documentation

## 2017-09-15 HISTORY — DX: Gastro-esophageal reflux disease without esophagitis: K21.9

## 2017-09-15 HISTORY — DX: Complete loss of teeth, unspecified cause, unspecified class: K08.109

## 2017-09-15 HISTORY — PX: MANDIBULAR HARDWARE REMOVAL: SHX5205

## 2017-09-15 HISTORY — PX: MASS EXCISION: SHX2000

## 2017-09-15 HISTORY — DX: Fracture of mandible, unspecified, initial encounter for closed fracture: S02.609A

## 2017-09-15 HISTORY — DX: Other psychoactive substance abuse, in remission: F19.11

## 2017-09-15 SURGERY — REMOVAL, HARDWARE, MANDIBLE
Anesthesia: General | Site: Mouth

## 2017-09-15 MED ORDER — FENTANYL CITRATE (PF) 100 MCG/2ML IJ SOLN
INTRAMUSCULAR | Status: AC
  Filled 2017-09-15: qty 2

## 2017-09-15 MED ORDER — 0.9 % SODIUM CHLORIDE (POUR BTL) OPTIME
TOPICAL | Status: DC | PRN
  Administered 2017-09-15: 50 mL

## 2017-09-15 MED ORDER — PROMETHAZINE HCL 25 MG/ML IJ SOLN: 6.2500 mg | INTRAMUSCULAR | Status: DC | PRN

## 2017-09-15 MED ORDER — FENTANYL CITRATE (PF) 100 MCG/2ML IJ SOLN: 50.0000 ug | INTRAMUSCULAR | Status: DC | PRN

## 2017-09-15 MED ORDER — SUCCINYLCHOLINE CHLORIDE 20 MG/ML IJ SOLN
INTRAMUSCULAR | Status: DC | PRN
  Administered 2017-09-15: 120 mg via INTRAVENOUS

## 2017-09-15 MED ORDER — ONDANSETRON HCL 4 MG/2ML IJ SOLN
INTRAMUSCULAR | Status: DC | PRN
  Administered 2017-09-15: 4 mg via INTRAVENOUS

## 2017-09-15 MED ORDER — PROPOFOL 10 MG/ML IV BOLUS
INTRAVENOUS | Status: DC | PRN
  Administered 2017-09-15: 200 mg via INTRAVENOUS

## 2017-09-15 MED ORDER — LIDOCAINE-EPINEPHRINE 2 %-1:100000 IJ SOLN
INTRAMUSCULAR | Status: DC | PRN
  Administered 2017-09-15: 5.1 mL

## 2017-09-15 MED ORDER — LACTATED RINGERS IV SOLN
INTRAVENOUS | Status: DC
  Administered 2017-09-15 (×2): via INTRAVENOUS

## 2017-09-15 MED ORDER — LIDOCAINE-EPINEPHRINE 2 %-1:100000 IJ SOLN
INTRAMUSCULAR | Status: AC
  Filled 2017-09-15: qty 1

## 2017-09-15 MED ORDER — DEXAMETHASONE SODIUM PHOSPHATE 10 MG/ML IJ SOLN
INTRAMUSCULAR | Status: AC
  Filled 2017-09-15: qty 1

## 2017-09-15 MED ORDER — MIDAZOLAM HCL 2 MG/2ML IJ SOLN
INTRAMUSCULAR | Status: AC
  Filled 2017-09-15: qty 2

## 2017-09-15 MED ORDER — SCOPOLAMINE 1 MG/3DAYS TD PT72: 1.0000 | MEDICATED_PATCH | Freq: Once | TRANSDERMAL | Status: DC | PRN

## 2017-09-15 MED ORDER — ONDANSETRON HCL 4 MG/2ML IJ SOLN
INTRAMUSCULAR | Status: AC
  Filled 2017-09-15: qty 2

## 2017-09-15 MED ORDER — LIDOCAINE 2% (20 MG/ML) 5 ML SYRINGE
INTRAMUSCULAR | Status: AC
  Filled 2017-09-15: qty 5

## 2017-09-15 MED ORDER — FENTANYL CITRATE (PF) 100 MCG/2ML IJ SOLN
25.0000 ug | INTRAMUSCULAR | Status: DC | PRN
  Administered 2017-09-15: 50 ug via INTRAVENOUS
  Administered 2017-09-15: 25 ug via INTRAVENOUS

## 2017-09-15 MED ORDER — LIDOCAINE-EPINEPHRINE 2 %-1:100000 IJ SOLN
INTRAMUSCULAR | Status: AC
  Filled 2017-09-15: qty 6.8

## 2017-09-15 MED ORDER — FENTANYL CITRATE (PF) 100 MCG/2ML IJ SOLN
INTRAMUSCULAR | Status: DC | PRN
  Administered 2017-09-15: 50 ug via INTRAVENOUS
  Administered 2017-09-15: 100 ug via INTRAVENOUS
  Administered 2017-09-15 (×2): 25 ug via INTRAVENOUS

## 2017-09-15 MED ORDER — CLINDAMYCIN PHOSPHATE 900 MG/50ML IV SOLN
900.0000 mg | INTRAVENOUS | Status: AC
  Administered 2017-09-15: 900 mg via INTRAVENOUS

## 2017-09-15 MED ORDER — MIDAZOLAM HCL 5 MG/5ML IJ SOLN
INTRAMUSCULAR | Status: DC | PRN
  Administered 2017-09-15: 2 mg via INTRAVENOUS

## 2017-09-15 MED ORDER — CLINDAMYCIN PHOSPHATE 900 MG/50ML IV SOLN
INTRAVENOUS | Status: AC
  Filled 2017-09-15: qty 50

## 2017-09-15 MED ORDER — BUPIVACAINE-EPINEPHRINE (PF) 0.5% -1:200000 IJ SOLN
INTRAMUSCULAR | Status: AC
  Filled 2017-09-15: qty 30

## 2017-09-15 MED ORDER — BACITRACIN ZINC 500 UNIT/GM EX OINT
TOPICAL_OINTMENT | CUTANEOUS | Status: AC
  Filled 2017-09-15: qty 0.9

## 2017-09-15 MED ORDER — SUCCINYLCHOLINE CHLORIDE 200 MG/10ML IV SOSY
PREFILLED_SYRINGE | INTRAVENOUS | Status: AC
  Filled 2017-09-15: qty 10

## 2017-09-15 MED ORDER — LIDOCAINE 2% (20 MG/ML) 5 ML SYRINGE
INTRAMUSCULAR | Status: DC | PRN
  Administered 2017-09-15: 100 mg via INTRAVENOUS

## 2017-09-15 MED ORDER — MIDAZOLAM HCL 2 MG/2ML IJ SOLN: 1.0000 mg | INTRAMUSCULAR | Status: DC | PRN

## 2017-09-15 MED ORDER — DEXAMETHASONE SODIUM PHOSPHATE 4 MG/ML IJ SOLN
INTRAMUSCULAR | Status: DC | PRN
Start: 2017-09-15 — End: 2017-09-15
  Administered 2017-09-15: 10 mg via INTRAVENOUS

## 2017-09-15 SURGICAL SUPPLY — 29 items
BLADE SURG 15 STRL LF DISP TIS (BLADE) ×2 IMPLANT
BLADE SURG 15 STRL SS (BLADE) ×2
CANISTER SUCT 1200ML W/VALVE (MISCELLANEOUS) ×4 IMPLANT
COVER MAYO STAND STRL (DRAPES) ×4 IMPLANT
ELECT COATED BLADE 2.86 ST (ELECTRODE) IMPLANT
ELECT REM PT RETURN 9FT ADLT (ELECTROSURGICAL)
ELECTRODE REM PT RTRN 9FT ADLT (ELECTROSURGICAL) IMPLANT
GLOVE BIO SURGEON STRL SZ 6.5 (GLOVE) ×6 IMPLANT
GLOVE BIO SURGEONS STRL SZ 6.5 (GLOVE) ×2
GLOVE ORTHO TXT STRL SZ7.5 (GLOVE) ×4 IMPLANT
GOWN STRL REUS W/ TWL LRG LVL3 (GOWN DISPOSABLE) ×4 IMPLANT
GOWN STRL REUS W/ TWL XL LVL3 (GOWN DISPOSABLE) ×2 IMPLANT
GOWN STRL REUS W/TWL LRG LVL3 (GOWN DISPOSABLE) ×4
GOWN STRL REUS W/TWL XL LVL3 (GOWN DISPOSABLE) ×2
NEEDLE DENTAL 27 LONG (NEEDLE) ×4 IMPLANT
NEEDLE PRECISIONGLIDE 27X1.5 (NEEDLE) IMPLANT
NS IRRIG 1000ML POUR BTL (IV SOLUTION) ×4 IMPLANT
PACK BASIN DAY SURGERY FS (CUSTOM PROCEDURE TRAY) ×4 IMPLANT
PENCIL FOOT CONTROL (ELECTRODE) IMPLANT
SCISSORS WIRE ANG 4 3/4 DISP (INSTRUMENTS) IMPLANT
SHEET MEDIUM DRAPE 40X70 STRL (DRAPES) ×4 IMPLANT
SUT CHROMIC 3 0 PS 2 (SUTURE) IMPLANT
SUT CHROMIC 4 0 PS 2 18 (SUTURE) ×4 IMPLANT
SYR CONTROL 10ML LL (SYRINGE) ×4 IMPLANT
TOWEL OR 17X24 6PK STRL BLUE (TOWEL DISPOSABLE) ×4 IMPLANT
TRAY DSU PREP LF (CUSTOM PROCEDURE TRAY) IMPLANT
TUBE CONNECTING 20'X1/4 (TUBING) ×1
TUBE CONNECTING 20X1/4 (TUBING) ×3 IMPLANT
YANKAUER SUCT BULB TIP NO VENT (SUCTIONS) ×4 IMPLANT
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
## 2017-09-15 NOTE — Anesthesia Preprocedure Evaluation (Addendum)
Anesthesia Evaluation  Patient identified by MRN, date of birth, ID band Patient awake    Reviewed: Allergy & Precautions, NPO status , Patient's Chart, lab work & pertinent test results  Airway Mallampati: IV   Neck ROM: Full  Mouth opening: Limited Mouth Opening  Dental  (+) Poor Dentition, Chipped, Dental Advisory Given   Pulmonary Current Smoker,    breath sounds clear to auscultation       Cardiovascular negative cardio ROS   Rhythm:Regular Rate:Normal     Neuro/Psych    GI/Hepatic GERD  ,(+)     substance abuse  , Hepatitis -, C  Endo/Other  negative endocrine ROS  Renal/GU negative Renal ROS     Musculoskeletal   Abdominal   Peds  Hematology   Anesthesia Other Findings   Reproductive/Obstetrics                            Anesthesia Physical  Anesthesia Plan  ASA: II  Anesthesia Plan: General   Post-op Pain Management:    Induction: Intravenous  PONV Risk Score and Plan: 2 and Ondansetron, Dexamethasone, Treatment may vary due to age or medical condition and Midazolam  Airway Management Planned: Nasal ETT  Additional Equipment: None  Intra-op Plan:   Post-operative Plan: Extubation in OR  Informed Consent: I have reviewed the patients History and Physical, chart, labs and discussed the procedure including the risks, benefits and alternatives for the proposed anesthesia with the patient or authorized representative who has indicated his/her understanding and acceptance.   Dental advisory given  Plan Discussed with: CRNA  Anesthesia Plan Comments: (7.47m Nasal RAE via right nare placed with Mac 4 blade for last procedure)       Anesthesia Quick Evaluation

## 2017-09-15 NOTE — Discharge Instructions (Addendum)
Recommendations:  1. Ibuprofen 600mg  po q6h prn pain; or Tylenol 650mg  q6h prn pain. 2. Chlorhexidine 0.12% Rinses; Rinse with 15mL po TID x3 wks. 3. Clindamycin 300mg  po tid x 1 week. 4. Ensure or Boost supplement three times daily - high protein. 5. Brush teeth as normal three times daily. 6. Apply to ice to left cheek.      Post Anesthesia Home Care Instructions  Activity: Get plenty of rest for the remainder of the day. A responsible individual must stay with you for 24 hours following the procedure.  For the next 24 hours, DO NOT: -Drive a car -Advertising copywriterperate machinery -Drink alcoholic beverages -Take any medication unless instructed by your physician -Make any legal decisions or sign important papers.  Meals: Start with liquid foods such as gelatin or soup. Progress to regular foods as tolerated. Avoid greasy, spicy, heavy foods. If nausea and/or vomiting occur, drink only clear liquids until the nausea and/or vomiting subsides. Call your physician if vomiting continues.  Special Instructions/Symptoms: Your throat may feel dry or sore from the anesthesia or the breathing tube placed in your throat during surgery. If this causes discomfort, gargle with warm salt water. The discomfort should disappear within 24 hours.  If you had a scopolamine patch placed behind your ear for the management of post- operative nausea and/or vomiting:  1. The medication in the patch is effective for 72 hours, after which it should be removed.  Wrap patch in a tissue and discard in the trash. Wash hands thoroughly with soap and water. 2. You may remove the patch earlier than 72 hours if you experience unpleasant side effects which may include dry mouth, dizziness or visual disturbances. 3. Avoid touching the patch. Wash your hands with soap and water after contact with the patch.
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
## 2017-09-15 NOTE — Transfer of Care (Signed)
Immediate Anesthesia Transfer of Care Note  Patient: Caleb Bush  Procedure(s) Performed: MANDIBULAR HARDWARE REMOVAL (N/A Mouth) BIOPSY OF TISSUE LEFT MANDIBLE (Left Mouth)  Patient Location: PACU  Anesthesia Type:General  Level of Consciousness: awake, sedated and patient cooperative  Airway & Oxygen Therapy: Patient Spontanous Breathing and Patient connected to face mask oxygen  Post-op Assessment: Report given to RN and Post -op Vital signs reviewed and stable  Post vital signs: Reviewed and stable  Last Vitals:  Vitals:   09/15/17 1224  BP: 128/87  Pulse: 65  Resp: 16  Temp: 36.4 C  SpO2: 99%    Last Pain:  Vitals:   09/15/17 1224  TempSrc: Oral  PainSc: 0-No pain         Complications: No apparent anesthesia complications
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
## 2017-09-15 NOTE — Anesthesia Procedure Notes (Signed)
Procedure Name: Intubation Date/Time: 09/15/2017 2:16 PM Performed by: Talbot Grumbling, CRNA Pre-anesthesia Checklist: Emergency Drugs available, Suction available and Patient being monitored Patient Re-evaluated:Patient Re-evaluated prior to induction Oxygen Delivery Method: Circle system utilized Preoxygenation: Pre-oxygenation with 100% oxygen Induction Type: IV induction Ventilation: Mask ventilation without difficulty Laryngoscope Size: Mac and 3 Grade View: Grade I Tube type: Oral Rae Tube size: 8.0 mm Number of attempts: 1 Airway Equipment and Method: Stylet Placement Confirmation: ETT inserted through vocal cords under direct vision,  positive ETCO2 and breath sounds checked- equal and bilateral Secured at: 20 cm Tube secured with: Tape Dental Injury: Teeth and Oropharynx as per pre-operative assessment
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
## 2017-09-15 NOTE — Interval H&P Note (Signed)
Anesthesia H&P Update: History and Physical Exam reviewed; patient is OK for planned anesthetic and procedure. ? ?

## 2017-09-15 NOTE — Anesthesia Postprocedure Evaluation (Signed)
Anesthesia Post Note  Patient: Aldean Bakeraron Taylor Xxxabee  Procedure(s) Performed: MANDIBULAR HARDWARE REMOVAL (N/A Mouth) BIOPSY OF TISSUE LEFT MANDIBLE (Left Mouth)     Patient location during evaluation: PACU Anesthesia Type: General Level of consciousness: awake and alert Pain management: pain level controlled Vital Signs Assessment: post-procedure vital signs reviewed and stable Respiratory status: spontaneous breathing, nonlabored ventilation and respiratory function stable Cardiovascular status: blood pressure returned to baseline and stable Postop Assessment: no apparent nausea or vomiting Anesthetic complications: no    Last Vitals:  Vitals:   09/15/17 1530 09/15/17 1545  BP: 121/78 117/71  Pulse: 72 71  Resp: 11 20  Temp:    SpO2: 95% 98%    Last Pain:  Vitals:   09/15/17 1530  TempSrc:   PainSc: 3                  Beryle Lathehomas E Brock

## 2017-09-16 ENCOUNTER — Encounter (HOSPITAL_BASED_OUTPATIENT_CLINIC_OR_DEPARTMENT_OTHER): Payer: Self-pay | Admitting: Oral Surgery

## 2018-01-06 NOTE — H&P (Signed)
 The surgical history has been reviewed and remains accurate without interval change.  The patient was re-examined and patient's physiologic condition has not changed significantly in the last 30 days. The condition still exists that makes this procedure necessary. The treatment plan remains the same, without new options for care.  No new pharmacological allergies or types of therapy has been initiated that would change the plan or the appropriateness of the plan.  The patient and/or family understand the potential benefits and risks.  Caleb Bush   Electronically signed by: Kerwin Boers, MD Resident 01/06/18 [phone]

## 2018-01-08 NOTE — H&P (Signed)
 The surgical history has been reviewed and remains accurate without interval change. The patient was re-examined and patient's physiologic condition has not changed significantly in the last 30 days. The condition still exists that makes this procedure necessary. The treatment plan remains the same, without new options for care. No new pharmacological allergies or types of therapy has been initiated that would change the plan or the appropriateness of the plan. The patient and/or family understand the potential benefits and risks.  I discussed with the patient that during his or her surgery, the attending surgeon may be performing surgery on another patient in another operating room, and during that time, qualified residents or other medical professionals will perform non-critical portions of the surgery. The patient demonstrated understanding and acceptance of this.   Proceed to OR for wound reconstruction of left foot.   Electronically signed by: Nancyann Randine Inks, MD 01/08/2018 6:20 AM      Electronically signed by: Nancyann Randine Inks, MD Resident 01/08/18 [phone]
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
## 2019-01-05 NOTE — Progress Notes (Addendum)
 Subjective  Caleb Bush is a 38 y.o. male      Patient presents with   Foot Pain    s/p ORIF of right ankle fx, right foot deltoid ligament repair on 03/26. Pt states that he has had some intermittent sharp pain.     Radiographs were taken secondary to surgery.   X-rays: of the right ankle were taken. I evaluated radiographs and discussed findings with the patient. Specific views lateral, ankle mortise, and an anterior posterior.   Findings:  SUBJECTIVE: The patient presents to clinic today for evaluation of his left lower extremity.  Radiographs taken reveal hardware present in the fibula.  There is also improvement in the medial alignment consistent with a deltoid ligament repair.  I discussed this finding with the patient.  Expected status post a deltoid ligament repair and fibula open reduction internal fixation of the right lower extremity.   OBJECTIVE: Clinical exam reveals skin staples and sutures to be in appropriate position.  There is no gapping evident.  No evidence of infection.  He has intact sensation distally.  Moderate edema is present.   IMPRESSION: Postop ankle fracture open reduction internal fixation, deltoid ligament rupture right lower extremity.   PLAN: The patient was placed below-the-knee cast today.  He will remain nonweightbearing, inversion stress was applied to decrease stress in the deltoid ligament.  Come back in to see me again in a few weeks for staple and suture removal.     Patient was placed in a short-leg cast, well molded fiberglass cast on the right lower extremity. Patient tolerated this well. Circulation was checked after application of the cast and the patient was noted to have brisk capillary refill times 5. Cast care and precautions were discussed with the patient and family. Patient understood instructions.  Patient Active Problem List   Diagnosis Date Noted   Closed fracture of right ankle, initial encounter 12/23/2018    Added  automatically from request for surgery [id number]    Allergies  Allergen Reactions   Ivp [Iodinated Diagnostic Agents] Itching    Outpatient Medications Marked as Taking for the 01/05/19 encounter (Office Visit) with Cullan Reilly, DPM  Medication Sig Dispense Refill   clindamycin  (CLEOCIN ) 150 mg capsule 2 capsules 4 times a day, x 10 days 80 capsule 0   ibuprofen  (ADVIL ,MOTRIN ) 800 mg tablet Take 800 mg by mouth every 8 (eight) hours as needed for Pain.     traMADol  (ULTRAM ) 50 mg tablet Take 50 mg by mouth every 6 (six) hours as needed for Pain.       Past Medical History, Past Surgery History, Allergies, Social History, and Family History were reviewed and updated.    Review of Systems- All systems reviewed and negative except as noted. Objective  There were no vitals filed for this visit.  General:  Well-developed, well-nourished, no acute distress, alert and oriented x3 Extremities:  No edema, no clubbing, no cyanosis, no joint deformity Skin:  Normal color, warm/dry, no focal rashes, no open lesions  Neurologic: No focal Motor/Sensory deficit, normal speech, ambulatory with/without assist, normal mood/affect, memory intact   Pain Assessment: 0-10 Pain Location: Foot Pain Orientation: Right Pain Descriptors: Discomfort;Sharp;Radiating Pain Frequency: Intermittent   Impression   1. Status post ORIF of right ankle fx, right foot deltoid ligament repair       Plan     Patient's Medications       Accurate as of January 05, 2019 10:07 AM. Reflects encounter med changes as of  last refresh        Continued Medications     Instructions  clindamycin  150 mg capsule Commonly known as:  CLEOCIN   2 capsules 4 times a day, x 10 days   ibuprofen  800 mg tablet Commonly known as:  ADVIL ,MOTRIN   800 mg, Oral, Every 8 hours as needed   traMADol  50 mg tablet Commonly known as:  ULTRAM   50 mg, Oral, Every 6 hours as needed        Orders Placed This Encounter  Procedures    Xr Ankle Min 3 Views Right   Patient Instructions  If you have any questions about the care you received today, please do not hesitate to call our office. Thank you.    No follow-ups on file.

## 2019-10-15 NOTE — Progress Notes (Signed)
 Subjective  Caleb Bush is a 39 y.o. male      Patient presents with   Ankle Pain    f/u RLE ankle fracture. Pt stated that he has had a lot of pain and swelling. 10/10 intermittent pain.     Radiographs were taken secondary to fracture.   X-rays: of the right ankle were taken. I evaluated radiographs and discussed findings with the patient. Specific views lateral, ankle mortise, and an anterior posterior.   Findings:  SUBJECTIVE:  The patient presents to clinic today for evaluation of his right lower extremity.  New set of radiographs was taken at this location secondary to the injury and subsequent surgical intervention.  The patient states that he does have pain as well as swelling.   OBJECTIVE:  Clinical exam reveals warm, dry, supple skin, mild amount of swelling is present at this time.  He states it does become more moderate throughout the day.  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  Pain is present medially and laterally at this location. Clinically, he has no ligamentous laxity.  Appropriate ankle joint range of motion.  Pain is present at the anterior compartment of the distal right leg as well as along the course of the medial aspect of the ankle joint.  No pain upon inversion or eversion, which was within normal limits.   IMPRESSION:  Right lower extremity edema, right lower extremity, status post open reduction and internal fixation of the fibula fracture, repair of the deltoid ligament continued edema.   PLAN:  The patient and I discussed the condition, discussed appropriate treatment options.  At this point, I would like the patient to begin a physical therapy course.  He was given a prescription for an oral anti-inflammatory.  He will see me again in a month for reevaluation.  Utilize compression sock as appropriate.  See me sooner than that 1 month appointment if any problems occur.      Patient Active Problem List   Diagnosis  Date Noted   Closed fracture of right ankle, initial encounter 12/23/2018    Added automatically from request for surgery [id number]    Allergies  Allergen Reactions   Ivp [Iodinated Diagnostic Agents] Itching    Outpatient Medications Marked as Taking for the 10/15/19 encounter (Office Visit) with Cullan Reilly, DPM  Medication Sig Dispense Refill   clindamycin  (CLEOCIN ) 150 mg capsule 2 capsules 4 times a day, x 10 days 80 capsule 0   ibuprofen  (ADVIL ,MOTRIN ) 800 mg tablet Take one tablet (800 mg dose) by mouth every 8 (eight) hours as needed for Pain. 90 tablet 3   traMADol  (ULTRAM ) 50 mg tablet Take 50 mg by mouth every 6 (six) hours as needed for Pain.       Past Medical History, Past Surgery History, Allergies, Social History, and Family History were reviewed and updated.    Review of Systems- All systems reviewed and negative except as noted. Objective   Vitals:   10/15/19 1115  Temp: 98 F (36.7 C)    General:  Well-developed, well-nourished, no acute distress, alert and oriented x3 Extremities:  No edema, no clubbing, no cyanosis, no joint deformity Skin:  Normal color, warm/dry, no focal rashes, no open lesions  Neurologic: No focal Motor/Sensory deficit, normal speech, ambulatory with/without assist, normal mood/affect, memory intact   Pain Assessment: 0-10 Pain Location: Ankle Pain Orientation: Right Pain Descriptors: Discomfort;Aching;Radiating Pain Frequency: Intermittent   Impression   1. Status post  ORIF of right ankle fx, right foot deltoid ligament repair       Plan     Patient's Medications       Accurate as of October 15, 2019 11:28 AM. Reflects encounter med changes as of last refresh        Continued Medications     Instructions  clindamycin  150 mg capsule Commonly known as: CLEOCIN   2 capsules 4 times a day, x 10 days   ibuprofen  800 mg tablet Commonly known as: ADVIL ,MOTRIN   800 mg, Oral, Every 8 hours as needed   traMADol  50 mg  tablet Commonly known as: ULTRAM   50 mg, Oral, Every 6 hours as needed        Orders Placed This Encounter  Procedures   Xr Ankle Min 3 Views Right   Patient Instructions  If you have any questions about the care you received today, please do not hesitate to call our office. Thank you.    No follow-ups on file.

## 2020-08-07 DIAGNOSIS — Z720 Tobacco use: Secondary | ICD-10-CM | POA: Diagnosis present

## 2020-08-07 DIAGNOSIS — R030 Elevated blood-pressure reading, without diagnosis of hypertension: Secondary | ICD-10-CM | POA: Diagnosis present

## 2020-08-07 NOTE — H&P (Signed)
 Medicine History and Physical   Chief Complaint: Chief Complaint  Patient presents with   Hip Pain    History of presenting illness: Chief Complaint  Patient presents with   Hip Pain   Caleb Bush is a 39 y.o. male with PMHx of IV drug abuse and recent  Infection and left hip arthrotomy with surgical debridement of bone due to left hip, septic arthritis and Osteomyelitis in September, 2021.  Patient was placed on IV Ancef (with cultures returning positive for MSSA) which he completed and stated that he appeared to be on the mend as he went from using a wheelchair to weightbearing independently.  Over the past 2 days, patient has been with worsening pain to the left hip as well a swelling and redness over the surgical site.  Due to his unbearable pain , he decided to present to the ED where he was found to be with evidence of a left hip joint abscess and findings consistent with left hip septic arthritis on CT Scan.  He was started on IV Vancomycin  and is to be admitted to the general medical floor for Orthopedic consultation and surgical intervention.  Review of Systems: Constitutional: no fevers Eyes: no discharge HENT: normal hearing Resp: no SOB, no cough CVS: no chest pain GI: no N/V GU: no dysuria Heme: no bleeding Musculoskeletal: left hip pain, redness and swelling Neuro: alert and oriented x4  Allergies: Penicillins and Ioxaglate sodium  Medications:  Prior to Admission medications   Not on File    Medical History: Past Medical History:  Diagnosis Date   Drug abuse (CMS-HCC)    Hepatitis C     Surgical History: Past Surgical History:  Procedure Laterality Date   HIP SURGERY      Social History: Social History   Socioeconomic History   Marital status: Single    Spouse name: Not on file   Number of children: Not on file   Years of education: Not on file   Highest education level: Not on file  Occupational History   Not on file  Tobacco  Use   Smoking status: Current Every Day Smoker    Packs/day: 0.50    Types: Cigarettes   Smokeless tobacco: Never Used  Vaping Use   Vaping Use: Never used  Substance and Sexual Activity   Alcohol use: No   Drug use: Not Currently    Types: Heroin, Cocaine    Comment: sober from heroin x 4 months   Sexual activity: Not on file  Other Topics Concern   Not on file  Social History Narrative   Not on file   Social Determinants of Health   Financial Resource Strain: Not on file  Food Insecurity: Not on file  Transportation Needs: Not on file  Physical Activity: Not on file  Stress: Not on file  Social Connections: Not on file    Family History: History reviewed. No pertinent family history.   Physical Exam: General : alert and no acute distress HEENT : Normocephalic. EOMI. Moist mucous membranes.  Neck: Neck supple  Chest : Non labored respiration. Chest bilaterally clear to auscultate with no wheezing or crackles CVS :  Regular rate and rhythm. Abdomen : Soft. Non tender. Bowel sounds present . CNS :  Alert, oriented x 3. Moving all extremities Extremities : decreased range of motion to the left hip due to pain, noted swelling of the length of the left leg, mild erythema noted around the surgical incision site  Labs/Studies:  Recent Results (from the past 24 hour(s))  Basic Metabolic Panel   Collection Time: 08/06/20 11:16 PM  Result Value Ref Range   Sodium 145 136 - 145 mmol/L   Potassium 4.5 3.5 - 5.1 mmol/L   Chloride 108 (H) 98 - 107 mmol/L   CO2 29.4 20.0 - 31.0 mmol/L   Anion Gap 8 (L) 9 - 15 mmol/L   BUN 17 9 - 23 mg/dL   Creatinine 9.38 (L) 9.29 - 1.30 mg/dL   BUN/Creatinine Ratio 28    EGFR CKD-EPI Non-African American, Male >90 >=60 mL/min/1.71m2   EGFR CKD-EPI African American, Male >90 >=60 mL/min/1.4m2   Glucose 108 (H) 70 - 99 mg/dL   Calcium 9.8 8.3 - 89.3 mg/dL  PT-INR   Collection Time: 08/06/20 11:16 PM  Result Value Ref Range   PT  12.8 11.8 - 14.9 sec   INR 0.97 0.90 - 1.10  APTT   Collection Time: 08/06/20 11:16 PM  Result Value Ref Range   APTT 34.5 (H) 25.3 - 34.3 sec  C-reactive protein   Collection Time: 08/06/20 11:16 PM  Result Value Ref Range   CRP 79.0 (H) <10.0 mg/L  Lactic Acid   Collection Time: 08/06/20 11:17 PM  Result Value Ref Range   Lactate 1.2 0.5 - 2.0 mmol/L  CBC w/ Differential   Collection Time: 08/06/20 11:17 PM  Result Value Ref Range   WBC 8.6 4.2 - 9.1 10*9/L   RBC 4.23 (L) 4.63 - 6.08 10*12/L   HGB 11.8 (L) 13.7 - 17.5 g/dL   HCT 63.0 (L) 59.8 - 48.9 %   MCV 87.2 79.0 - 92.2 fL   MCH 27.9 25.7 - 32.2 pg   MCHC 32.0 (L) 32.3 - 36.5 g/dL   RDW 84.7 (H) 88.3 - 85.5 %   MPV 10.4 9.4 - 12.4 fL   Platelet 419 (H) 163 - 337 10*9/L   Neutrophils % 61.1 %   Immature Granulocytes Relative Percent 0.3 %   Lymphocytes % 26.6 %   Monocytes % 8.6 %   Eosinophils % 2.9 %   Basophils % 0.5 %   Absolute Neutrophils 5.2 1.8 - 5.4 10*9/L   Immature Granulocytes Absolute Count 0.0 0.0 - 1.0 10*9/L   Absolute Lymphocytes 2.3 1.3 - 3.6 10*9/L   Absolute Monocytes 0.7 0.3 - 0.8 10*9/L   Absolute Eosinophils 0.3 0.0 - 0.5 10*9/L   Absolute Basophils 0.0 0.0 - 0.1 10*9/L  Sedimentation Rate   Collection Time: 08/06/20 11:17 PM  Result Value Ref Range   Sed Rate 45 (H) 0 - 15 mm/h  COVID-19 PCR   Collection Time: 08/07/20  8:47 AM   Specimen: Nasopharyngeal/Oropharyngeal Swab  Result Value Ref Range   SARS-CoV-2 PCR Negative Negative  Aerobic/Anaerobic Culture   Collection Time: 08/07/20 11:18 AM   Specimen: Hip, Left; Swab, Intraoperative Collection  Result Value Ref Range   Gram Stain Result 3+ White Blood Cells Present    Gram Stain Result 1+ Gram positive cocci     RADIOLOGY:  CT Lower Extremity, Left Left hip septic arthritis. Direct extension of abscess from the left hip joint space into the overlying left gluteal muscles.  Venous Doppler: Negative examination of the deep  venous system of the left lower extremity. There is no evidence of femoropopliteal deep vein thrombosis.   Impression + Plan:     Patient Active Problem List  Diagnosis   Septic arthritis (CMS-HCC)   Anemia   Elevated blood pressure, situational  Osteomyelitis (CMS-HCC)   Tobacco abuse   Septic Arthritis: --As evidenced on CT Scan of the left hip --Orthopedic Surgery consulted to evaluate for need for surgical intervention --Will continue IV Vancomycin  and add Cefepime .  Will likely have intraoperative cultures collected.  Follow for final results and tailoring of antibiotics --Morphine  and Percocet as needed for pain control --PT to eval after surgical intervention  Anemia: --Normocytic Anemia --Check Iron , Ferritin levels --Trend with daily CBC --Plan to transfuse if Hgb falls below 7  Elevated Blood Pressure: --Likely due to pain as no prior history of hypertension --Will attempt pain control and continue to monitor --Routine vitals --IV hydralazine  to be administered as needed with parameters --IF BP remains elevated despite satisfactory pain control, will consider initiating Norvasc   Tobacco Abuse: --Nicotine  Patch ordered --Smoking cessation counseling to be provided  DVT proph: SCDs Code:  Full Code Dispo: Plan for discharge after surgical intervention and return of final wound cx  D/w pt and all questions answered  60  mins time spent  Please be advised, this note was prepared with Dragon voice dictation. Every effort was made to remove inappropriate transcription. However, it may contain some inadvertent misspellings or incorrect transcriptions.

## 2020-08-07 NOTE — Unmapped External Note (Signed)
 OPERATIVE NOTE - Hip Hemiarthroplasty Posterior  Patient name: Caleb Bush DOB: Nov 14, 1980       MEDICAL RECORD [id number]  Date of procedure: August 07, 2020  Pre-operative diagnosis: Left septic hip and osteomylitis  Post-operative diagnosis: Same  Procedure performed: Left posterior approach [place], left hip I&D, antibiotic bead placement  Implants: None  Surgeon: Royetta Laws, M.D.  Assisting Surgeon: Cathlyn Orn, MD  Anesthesia: general  Estimated blood loss: 250 mL  Drains: None  Specimens: 4X cultures aerobic anaerobic, femoral head sent for pathology  Complications: None apparent   HPI: I was consulted intraoperatively for this patient.  Patient had a previous left septic hip that was treated with irrigation debridement IV antibiotics.  He presented to emergency department with increased pain.  CT scan demonstrated an abscess.  He was taken to the OR by Dr. Orn.  Dr. Orn consulted me intraoperatively given the extent of his infection and severe involvement of the acetabulum.  Description of Procedure:   The patient was taken to the operating room after the correct surgical site was marked in the preop holding area. The patient was placed under anesthesia and placed in the lateral position on an orthopaedic table. Preoperative antibiotics were held, but tranexamic acid were given. All bony prominences were padded. The Left lower extremity was then prepped and draped the typical sterile fashion.   A posterior approach was utilized to access the hip. An incision was made over the greater trochanteric region in a curvilinear fashion, similar to previous. The tensor fascia lata was identified and split. A charnley retractor was then placed to aid visualization. Dissection was carried down the the level of the abductor tendon, which was retracted superiorly to allow visualization of the short external rotators. Piriformis was identified with an ethibond  suture, ressected, and tagged for a formal posterior repair during closure.  Upon opening the capsule, extensive purulence was encountered.  4 cultures were taken for aerobic and anaerobic bacteria. The remaining posterior capsule was released from the femoral neck.  The femoral neck was cut with the saw.  A corkscrew device was used to remove the femoral head and neck.  This was sent off for pathology.  Extensive scar tissue was found.  This was debrided with a curette and rongeur.  The debridement went all the way into the acetabulum.  A large acetabular defects were found superiorly, posteriorly, anteriorly.  These were again debrided with a rongeur and the curette down to the level of good bleeding bone.  Following this the wound was irrigated with a dilute Betadine solution.  This was washed out with sterile saline, approximately 1 L.  We then irrigated the acetabulum with 500 mL of irrisept.  This also was washed out with approximately 2 L of sterile saline.  Antibiotic loaded calcium phosphate beads were implanted into the acetabulum.  These contain 1 g of vancomycin  and 1 g of tobramycin.  An additional 2 g of vancomycin  and 4 g of tobramycin powder were placed in the acetabulum.  A formal posterior repair of the posterior capsule and external rotators was undergone. Drill holes were made through the greater trochanter and the sutures were then passed and tied. The tensor fascia lata was then closed in a running fashion with barbed suture. The subcutaneous tissue was closed in a layered fashion with 2-0 PDS.  The skin was closed with staples.  The wound was then cleaned and a sterile Praveena dressing was applied.  The patient was then  safely transferred off the operating room table and onto a bed.  He was awoken from general anesthesia.  He was transported to the PACU in a stable condition.   He will be admitted to the medicine service.  Infectious disease will be consulted for antibiotic  management.  He will likely need a PICC line with at least 6 weeks of IV antibiotics.  Case management will assist given his complex social situation.  He will be nonweightbearing.  Orthopedics will follow along while he is in the hospital.

## 2020-08-09 NOTE — Progress Notes (Addendum)
° °  ORTHOPEDIC SURGERY PROGRESS NOTE  Hospital day: 2 Problem list: Septic arthritis (CMS-HCC)   Subjective: .Caleb Bush is post-operative day 2, status post Procedure(s): ACETABULOPLASTY; RESECT FEMORAL HEAD, by Royetta Debby Laws, MD.   Patient reports operative site pain.  Vital signs in last 24 hours: Temp:  [36.6 C (97.8 F)-37.1 C (98.8 F)] 36.7 C (98.1 F) Heart Rate:  [82-98] 90 Resp:  [12-20] 16 BP: (135-186)/(85-103) 138/98 MAP (mmHg):  [103-131] 111 SpO2:  [96 %-99 %] 99 %   Physical Exam: General:resting comfortably , no distress and unlabored respirations Neuro:alert and oriented, appropriate mood and affect and appropriate speech Operative site:  Left hip  dressing/splint clean and dry, calf is soft and nontender, negative Homan's sign, 2+ distal pulse and sensation is intact and strong to light touch.  Lab results: Lab Results  Component Value Date   WBC 10.8 (H) 08/09/2020   HGB 10.1 (L) 08/09/2020   HCT 32.3 (L) 08/09/2020   PLT 378 (H) 08/09/2020   Lab Results  Component Value Date   NA 140 08/09/2020   K 4.0 08/09/2020   CL 104 08/09/2020   CO2 28.5 08/09/2020   BUN 7 (L) 08/09/2020   CREATININE 0.52 (L) 08/09/2020   CALCIUM 9.3 08/09/2020   Lab Results  Component Value Date   ALKPHOS 86 08/09/2020   BILITOT 0.3 08/09/2020   PROT 6.3 08/09/2020   ALBUMIN 2.8 (L) 08/09/2020   ALT 12 08/09/2020   AST 17 08/09/2020   Lab Results  Component Value Date   PT 12.8 08/06/2020   INR 0.97 08/06/2020   APTT 34.5 (H) 08/06/2020    Assessment and Plan: POD 2 status post Procedure(s): ACETABULOPLASTY; RESECT FEMORAL HEAD  - Continue NWB LLE. PT for ROM and ADLs. - Continue IV abx per ID recommendations; Cefepime  and Vancomycin  - Blood cultures remain negative, aerobic/anaerobic culture  2+ Coagulase negative Staphylococcus species - DVT prophylaxis - Lovenox  40mg  - Placement pending    Ileana Turconi PA-C

## 2020-08-10 NOTE — Progress Notes (Signed)
° °  ORTHOPEDIC SURGERY PROGRESS NOTE  Hospital day: 3 Problem list: Septic arthritis (CMS-HCC)   Subjective: .Caleb Bush is post-operative day 3, status post Procedure(s): ACETABULOPLASTY; RESECT FEMORAL HEAD, by Royetta Debby Laws, MD.   Patient reports operative site pain.  Vital signs in last 24 hours: Temp:  [36.7 C (98.1 F)-37.1 C (98.7 F)] 37.1 C (98.7 F) Heart Rate:  [83-111] 83 Resp:  [15-20] 17 BP: (131-149)/(80-90) 134/80 MAP (mmHg):  [98-110] 98 SpO2:  [97 %-99 %] 99 %   Physical Exam: General:resting comfortably , no distress and unlabored respirations Neuro:alert and oriented, appropriate mood and affect and appropriate speech Operative site:  Left hip  dressing/splint clean and dry, calf is soft and nontender, negative Homan's sign, 2+ distal pulse and sensation is intact and strong to light touch.  Lab results: Lab Results  Component Value Date   WBC 9.8 (H) 08/10/2020   HGB 9.3 (L) 08/10/2020   HCT 30.0 (L) 08/10/2020   PLT 406 (H) 08/10/2020   Lab Results  Component Value Date   NA 141 08/10/2020   K 4.2 08/10/2020   CL 107 08/10/2020   CO2 27.7 08/10/2020   BUN 10 08/10/2020   CREATININE 0.53 (L) 08/10/2020   CALCIUM 9.4 08/10/2020   Lab Results  Component Value Date   ALKPHOS 79 08/10/2020   BILITOT 0.3 08/10/2020   PROT 6.1 08/10/2020   ALBUMIN 2.4 (L) 08/10/2020   ALT 10 08/10/2020   AST 15 08/10/2020   Lab Results  Component Value Date   PT 12.8 08/06/2020   INR 0.97 08/06/2020   APTT 34.5 (H) 08/06/2020    Assessment and Plan: POD 3 status post Procedure(s): ACETABULOPLASTY; RESECT FEMORAL HEAD  - Continue NWB LLE. PT for ROM and ADLs. - Continue IV abx per ID recommendations; Cefepime  and Vancomycin  - Blood cultures remain negative, aerobic/anaerobic culture  2+ Coagulase negative Staphylococcus species - DVT prophylaxis - Lovenox  40mg   - Placement pending - Follow up with Dr.Bagsby in 1-2 weeks following discharge.   - [facility] with any further questions or concerns.   Kayla Turconi PA-C

## 2020-12-05 ENCOUNTER — Emergency Department (HOSPITAL_BASED_OUTPATIENT_CLINIC_OR_DEPARTMENT_OTHER)
Admission: EM | Admit: 2020-12-05 | Discharge: 2020-12-05 | Disposition: A | Discharge status: Home / Self Care | Attending: Emergency Medicine | Admitting: Emergency Medicine

## 2020-12-05 ENCOUNTER — Other Ambulatory Visit: Payer: Self-pay

## 2020-12-05 ENCOUNTER — Encounter (HOSPITAL_BASED_OUTPATIENT_CLINIC_OR_DEPARTMENT_OTHER): Payer: Self-pay | Admitting: *Deleted

## 2020-12-05 DIAGNOSIS — M25552 Pain in left hip: Secondary | ICD-10-CM | POA: Insufficient documentation

## 2020-12-05 DIAGNOSIS — F17210 Nicotine dependence, cigarettes, uncomplicated: Secondary | ICD-10-CM | POA: Insufficient documentation

## 2020-12-05 DIAGNOSIS — Z760 Encounter for issue of repeat prescription: Secondary | ICD-10-CM | POA: Insufficient documentation

## 2020-12-05 MED ORDER — AMLODIPINE BESYLATE 5 MG PO TABS: 5.0000 mg | ORAL_TABLET | Freq: Every day | ORAL | 1 refills | Status: AC

## 2020-12-05 NOTE — ED Triage Notes (Addendum)
Left hip pain.  He has been incarcerated for the past year and was released 2 weeks ago. He was diagnosed with a staph infection in his hip while in jail. He has had problems with his hip since. His femur, part of his pelvis have been removed due to the infection. Here today with left hip pain. He had a hip aspiration 3 weeks ago. He does not have an infection. States he also needs a Rx for BP medication.

## 2020-12-05 NOTE — ED Notes (Signed)
Has had surgery on left hip, sept 2021, nov 2021. 2 weeks ago at [facility] in [place]. Today having pain in left hip describes as "uncomfortable" rates pain an 8 on 0-10 scale
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
## 2020-12-05 NOTE — Discharge Instructions (Addendum)
Schedule an appointment with an orthopedic doctor.  I have provided the information for  [facility] and [facility].  Follow-up with your primary care doctor to manage her blood pressure.

## 2020-12-05 NOTE — ED Provider Notes (Signed)
[facility] EMERGENCY DEPARTMENT Provider Note   CSN: [id number] Arrival date & time: 12/05/20  1211     History Chief Complaint  Patient presents with  . Leg Pain  . Medication Refill    Caleb Bush is a 40 y.o. male.  HPI   Patient has a history of complicated hip problems.  According to the medical records back in November he was diagnosed with osteomyelitis and pyogenic arthritis of the left hip.  He was admitted to a [facility] hospital.  Patient had been previously treated with  left hip arthrotomy and surgical debridement of the bone.  Patient was also seen by infectious disease and had extended treatment with antibiotics.  Patient was ultimately discharged in released back to the [facility].  Patient had been incarcerated.  Patient states he was released recently.  He needs to follow-up with an orthopedic doctor for hip replacement surgery.  Patient comes to the ED here today with his mother because they need an orthopedic referral.  Patient also states he does not have his blood pressure medications.  He has been taking Norvasc 5 mg.  He is not having any other acute symptoms.  No fevers or chills.  Past Medical History:  Diagnosis Date  . GERD (gastroesophageal reflux disease)   . Hepatitis C   . History of substance abuse (HCC)   . Mandible fracture (HCC) 07/18/2017  . Teeth missing     Patient Active Problem List   Diagnosis Date Noted  . Bilateral fracture of mandible, open, initial encounter (HCC) 07/17/2017  . Heroin abuse (HCC) 04/06/2014  . Polysubstance abuse (HCC) 04/06/2014  . Altered mental status 04/06/2014  . Hepatitis, chronic (HCC) 04/06/2014  . Acute respiratory failure with hypoxia (HCC) 04/06/2014    Past Surgical History:  Procedure Laterality Date  . CLOSED REDUCTION MANDIBLE Right 07/18/2017   Procedure: CLOSED REDUCTION CONDYLAR FRACTURE;  Surgeon: Vivia Ewing, DMD;  Location: [facility] OR;  Service: Oral Surgery;  Laterality:  Right;  . HIP SURGERY    . MANDIBULAR HARDWARE REMOVAL N/A 09/15/2017   Procedure: MANDIBULAR HARDWARE REMOVAL;  Surgeon: Vivia Ewing, DMD;  Location: [facility];  Service: Oral Surgery;  Laterality: N/A;  . MASS EXCISION Left 09/15/2017   Procedure: BIOPSY OF TISSUE LEFT MANDIBLE;  Surgeon: Vivia Ewing, DMD;  Location: [facility];  Service: Oral Surgery;  Laterality: Left;  Marland Kitchen MULTIPLE EXTRACTIONS WITH ALVEOLOPLASTY Bilateral 07/18/2017   Procedure: MULTIPLE EXTRACTION OF # 1, 14, 19, 30 & 31;  Surgeon: Vivia Ewing, DMD;  Location: [facility] OR;  Service: Oral Surgery;  Laterality: Bilateral;  . ORIF MANDIBULAR FRACTURE Left 07/18/2017   Procedure: OPEN REDUCTION INTERNAL FIXATION (ORIF) MANDIBULAR FRACTURE;  Surgeon: Vivia Ewing, DMD;  Location: [facility] OR;  Service: Oral Surgery;  Laterality: Left;       No family history on file.  Social History   Tobacco Use  . Smoking status: Current Some Day Smoker    Packs/day: 0.00    Types: Cigarettes  . Smokeless tobacco: Never Used  . Tobacco comment: smoking prohibited currently - is incarcerated  Vaping Use  . Vaping Use: Never used  Substance Use Topics  . Alcohol use: No  . Drug use: No    Comment: hx. of substance abuse - currently in [place]. [facility] Medications Prior to Admission medications   Medication Sig Start Date End Date Taking? Authorizing Provider  amLODipine (NORVASC) 5 MG tablet Take 1  tablet (5 mg total) by mouth daily. 12/05/20  Yes Linwood Dibbles, MD  omeprazole (PRILOSEC) 20 MG capsule Take 20 mg by mouth daily.   Yes [provider]  acetaminophen (TYLENOL) 325 MG tablet Take 650 mg by mouth every 6 (six) hours as needed for mild pain.    [provider]  chlorhexidine (PERIDEX) 0.12 % solution Use as directed 15 mLs in the mouth or throat 3 (three) times daily.    [provider]  Nutritional Supplements (ENSURE PLUS PO) Take 2 Bottles by mouth 3 (three)  times daily.    [provider]    Allergies    Clonidine derivatives, Ivp dye [iodinated diagnostic agents], and Penicillins  Review of Systems   Review of Systems  All other systems reviewed and are negative.   Physical Exam Updated Vital Signs BP (!) 154/100 (BP Location: Right Arm)   Pulse 95   Temp 97.9 F (36.6 C) (Oral)   Resp 20   Ht 1.778 m (5\' 10" )   Wt 76.3 kg   SpO2 97%   BMI 24.15 kg/m   Physical Exam Vitals and nursing note reviewed.  Constitutional:      General: He is not in acute distress.    Appearance: He is well-developed.  HENT:     Head: Normocephalic and atraumatic.     Right Ear: External ear normal.     Left Ear: External ear normal.  Eyes:     General: No scleral icterus.       Right eye: No discharge.        Left eye: No discharge.     Conjunctiva/sclera: Conjunctivae normal.  Neck:     Trachea: No tracheal deviation.  Cardiovascular:     Rate and Rhythm: Normal rate.  Pulmonary:     Effort: Pulmonary effort is normal. No respiratory distress.     Breath sounds: No stridor.  Abdominal:     General: There is no distension.  Musculoskeletal:        General: No swelling or deformity.     Cervical back: Neck supple.  Skin:    General: Skin is warm and dry.     Findings: No rash.  Neurological:     Mental Status: He is alert.     Cranial Nerves: Cranial nerve deficit: no gross deficits.     ED Results / Procedures / Treatments   Labs (all labs ordered are listed, but only abnormal results are displayed) Labs Reviewed - No data to display  EKG None  Radiology No results found.  Procedures Procedures   Medications Ordered in ED Medications - No data to display  ED Course  I have reviewed the triage vital signs and the nursing notes.  Pertinent labs & imaging results that were available during my care of the patient were reviewed by me and considered in my medical decision making (see chart for details).     MDM Rules/Calculators/A&P                          Patient presents to have a referral to an orthopedic doctor.  He has had persistent pain since his prior surgeries.  Not having any fevers or chills.  Not complaining of acute infection.  I have provided the name of an orthopedic doctor.  Patient also requested refills blood pressure medications.  Patient did not remember the name but I do see from the medical records he had  been prescribed Norvasc. Final Clinical Impression(s) / ED Diagnoses Final diagnoses:  Medication refill    Rx / DC Orders ED Discharge Orders         Ordered    amLODipine (NORVASC) 5 MG tablet  Daily        12/05/20 1347           Linwood Dibbles, MD 12/05/20 1353

## 2021-08-03 ENCOUNTER — Other Ambulatory Visit: Payer: Self-pay

## 2021-08-03 ENCOUNTER — Encounter (HOSPITAL_BASED_OUTPATIENT_CLINIC_OR_DEPARTMENT_OTHER): Payer: Self-pay

## 2021-08-03 DIAGNOSIS — F17210 Nicotine dependence, cigarettes, uncomplicated: Secondary | ICD-10-CM | POA: Insufficient documentation

## 2021-08-03 DIAGNOSIS — L03114 Cellulitis of left upper limb: Secondary | ICD-10-CM | POA: Insufficient documentation

## 2021-08-03 NOTE — ED Triage Notes (Addendum)
Pt c/o redness,swelling pain to left elbow/UE x 2 days-reports hx of IV drug user-denies recent use-states area is similar to when he had infection from IV drug use in the past-NAD-limping gait with own cane/baseline

## 2021-08-04 ENCOUNTER — Inpatient Hospital Stay (HOSPITAL_COMMUNITY)
Admission: EM | Admit: 2021-08-04 | Discharge: 2021-08-06 | DRG: 603 | Discharge status: Against Medical Advice | Payer: Medicaid Other | Attending: Student | Admitting: Student

## 2021-08-04 ENCOUNTER — Emergency Department (HOSPITAL_COMMUNITY): Payer: Medicaid Other

## 2021-08-04 ENCOUNTER — Encounter (HOSPITAL_BASED_OUTPATIENT_CLINIC_OR_DEPARTMENT_OTHER): Payer: Self-pay | Admitting: Emergency Medicine

## 2021-08-04 ENCOUNTER — Encounter (HOSPITAL_COMMUNITY): Payer: Self-pay

## 2021-08-04 ENCOUNTER — Emergency Department (HOSPITAL_BASED_OUTPATIENT_CLINIC_OR_DEPARTMENT_OTHER)
Admission: EM | Admit: 2021-08-04 | Discharge: 2021-08-04 | Discharge status: Against Medical Advice | Payer: Medicaid Other | Source: Home / Self Care | Attending: Emergency Medicine | Admitting: Emergency Medicine

## 2021-08-04 ENCOUNTER — Other Ambulatory Visit: Payer: Self-pay

## 2021-08-04 DIAGNOSIS — L03114 Cellulitis of left upper limb: Principal | ICD-10-CM | POA: Diagnosis present

## 2021-08-04 DIAGNOSIS — L0390 Cellulitis, unspecified: Secondary | ICD-10-CM | POA: Diagnosis present

## 2021-08-04 DIAGNOSIS — I10 Essential (primary) hypertension: Secondary | ICD-10-CM | POA: Diagnosis present

## 2021-08-04 DIAGNOSIS — R5381 Other malaise: Secondary | ICD-10-CM | POA: Diagnosis not present

## 2021-08-04 DIAGNOSIS — Z9189 Other specified personal risk factors, not elsewhere classified: Secondary | ICD-10-CM | POA: Diagnosis not present

## 2021-08-04 DIAGNOSIS — F17210 Nicotine dependence, cigarettes, uncomplicated: Secondary | ICD-10-CM | POA: Diagnosis present

## 2021-08-04 DIAGNOSIS — Z20822 Contact with and (suspected) exposure to covid-19: Secondary | ICD-10-CM | POA: Diagnosis present

## 2021-08-04 DIAGNOSIS — Z993 Dependence on wheelchair: Secondary | ICD-10-CM | POA: Diagnosis not present

## 2021-08-04 DIAGNOSIS — F1110 Opioid abuse, uncomplicated: Secondary | ICD-10-CM | POA: Diagnosis present

## 2021-08-04 DIAGNOSIS — F1990 Other psychoactive substance use, unspecified, uncomplicated: Secondary | ICD-10-CM | POA: Diagnosis not present

## 2021-08-04 DIAGNOSIS — K219 Gastro-esophageal reflux disease without esophagitis: Secondary | ICD-10-CM | POA: Diagnosis present

## 2021-08-04 DIAGNOSIS — R262 Difficulty in walking, not elsewhere classified: Secondary | ICD-10-CM | POA: Diagnosis not present

## 2021-08-04 LAB — LACTIC ACID, PLASMA: Lactic Acid, Venous: 1.3 mmol/L (ref 0.5–1.9)

## 2021-08-04 LAB — CBC WITH DIFFERENTIAL/PLATELET
Abs Immature Granulocytes: 0.04 10*3/uL (ref 0.00–0.07)
Basophils Absolute: 0 10*3/uL (ref 0.0–0.1)
Basophils Relative: 0 %
Eosinophils Absolute: 0.1 10*3/uL (ref 0.0–0.5)
Eosinophils Relative: 1 %
HCT: 41.2 % (ref 39.0–52.0)
Hemoglobin: 13.2 g/dL (ref 13.0–17.0)
Immature Granulocytes: 0 %
Lymphocytes Relative: 10 %
Lymphs Abs: 1.2 10*3/uL (ref 0.7–4.0)
MCH: 28.3 pg (ref 26.0–34.0)
MCHC: 32 g/dL (ref 30.0–36.0)
MCV: 88.2 fL (ref 80.0–100.0)
Monocytes Absolute: 0.7 10*3/uL (ref 0.1–1.0)
Monocytes Relative: 6 %
Neutro Abs: 10.1 10*3/uL — ABNORMAL HIGH (ref 1.7–7.7)
Neutrophils Relative %: 83 %
Platelets: 275 10*3/uL (ref 150–400)
RBC: 4.67 MIL/uL (ref 4.22–5.81)
RDW: 13 % (ref 11.5–15.5)
WBC: 12.2 10*3/uL — ABNORMAL HIGH (ref 4.0–10.5)
nRBC: 0 % (ref 0.0–0.2)

## 2021-08-04 LAB — COMPREHENSIVE METABOLIC PANEL
ALT: 31 U/L (ref 0–44)
AST: 39 U/L (ref 15–41)
Albumin: 3.3 g/dL — ABNORMAL LOW (ref 3.5–5.0)
Alkaline Phosphatase: 91 U/L (ref 38–126)
Anion gap: 7 (ref 5–15)
BUN: 9 mg/dL (ref 6–20)
CO2: 28 mmol/L (ref 22–32)
Calcium: 9 mg/dL (ref 8.9–10.3)
Chloride: 104 mmol/L (ref 98–111)
Creatinine, Ser: 0.57 mg/dL — ABNORMAL LOW (ref 0.61–1.24)
GFR, Estimated: >60 mL/min (ref >60)
Glucose, Bld: 100 mg/dL — ABNORMAL HIGH (ref 70–99)
Potassium: 3.9 mmol/L (ref 3.5–5.1)
Sodium: 139 mmol/L (ref 135–145)
Total Bilirubin: 0.5 mg/dL (ref 0.3–1.2)
Total Protein: 7.4 g/dL (ref 6.5–8.1)

## 2021-08-04 LAB — RESP PANEL BY RT-PCR (FLU A&B, COVID) ARPGX2
Influenza A by PCR: NEGATIVE
Influenza B by PCR: NEGATIVE
SARS Coronavirus 2 by RT PCR: NEGATIVE

## 2021-08-04 LAB — ETHANOL: Alcohol, Ethyl (B): <10 mg/dL (ref <10)

## 2021-08-04 MED ORDER — IBUPROFEN 400 MG PO TABS
400.0000 mg | ORAL_TABLET | Freq: Once | ORAL | Status: AC
  Administered 2021-08-04: 400 mg via ORAL
  Filled 2021-08-04: qty 1

## 2021-08-04 MED ORDER — CHLORDIAZEPOXIDE HCL 25 MG PO CAPS: 25.0000 mg | ORAL_CAPSULE | Freq: Every day | ORAL | Status: DC

## 2021-08-04 MED ORDER — HYDROXYZINE HCL 25 MG PO TABS
25.0000 mg | ORAL_TABLET | Freq: Four times a day (QID) | ORAL | Status: DC | PRN
  Administered 2021-08-04: 25 mg via ORAL
  Filled 2021-08-04: qty 1

## 2021-08-04 MED ORDER — VANCOMYCIN HCL 1500 MG/300ML IV SOLN
1500.0000 mg | Freq: Once | INTRAVENOUS | Status: AC
  Administered 2021-08-04: 1500 mg via INTRAVENOUS
  Filled 2021-08-04: qty 300

## 2021-08-04 MED ORDER — METRONIDAZOLE 500 MG/100ML IV SOLN
500.0000 mg | Freq: Two times a day (BID) | INTRAVENOUS | Status: DC
  Administered 2021-08-04 – 2021-08-06 (×4): 500 mg via INTRAVENOUS
  Filled 2021-08-04 (×5): qty 100

## 2021-08-04 MED ORDER — CLINDAMYCIN HCL 300 MG PO CAPS: 300.0000 mg | ORAL_CAPSULE | Freq: Four times a day (QID) | ORAL | 0 refills | Status: DC

## 2021-08-04 MED ORDER — LOPERAMIDE HCL 2 MG PO CAPS: 2.0000 mg | ORAL_CAPSULE | ORAL | Status: DC | PRN

## 2021-08-04 MED ORDER — LACTATED RINGERS IV SOLN: INTRAVENOUS | Status: AC

## 2021-08-04 MED ORDER — ADULT MULTIVITAMIN W/MINERALS CH
1.0000 | ORAL_TABLET | Freq: Every day | ORAL | Status: DC
  Administered 2021-08-05 – 2021-08-06 (×2): 1 via ORAL
  Filled 2021-08-04 (×2): qty 1

## 2021-08-04 MED ORDER — CHLORDIAZEPOXIDE HCL 25 MG PO CAPS: 25.0000 mg | ORAL_CAPSULE | ORAL | Status: DC

## 2021-08-04 MED ORDER — ACETAMINOPHEN 325 MG PO TABS: 650.0000 mg | ORAL_TABLET | Freq: Four times a day (QID) | ORAL | Status: DC | PRN

## 2021-08-04 MED ORDER — ONDANSETRON 4 MG PO TBDP: 4.0000 mg | ORAL_TABLET | Freq: Four times a day (QID) | ORAL | Status: DC | PRN

## 2021-08-04 MED ORDER — ACETAMINOPHEN 500 MG PO TABS: 1000.0000 mg | ORAL_TABLET | Freq: Once | ORAL | Status: DC

## 2021-08-04 MED ORDER — CLINDAMYCIN PHOSPHATE 900 MG/50ML IV SOLN: 900.0000 mg | Freq: Once | INTRAVENOUS | Status: DC

## 2021-08-04 MED ORDER — CHLORDIAZEPOXIDE HCL 25 MG PO CAPS
25.0000 mg | ORAL_CAPSULE | Freq: Four times a day (QID) | ORAL | Status: DC | PRN
  Filled 2021-08-04: qty 5

## 2021-08-04 MED ORDER — VANCOMYCIN HCL IN DEXTROSE 1-5 GM/200ML-% IV SOLN: 1000.0000 mg | Freq: Once | INTRAVENOUS | Status: DC

## 2021-08-04 MED ORDER — VANCOMYCIN HCL 1000 MG/200ML IV SOLN: 1000.0000 mg | Freq: Once | INTRAVENOUS | Status: DC

## 2021-08-04 MED ORDER — THIAMINE HCL 100 MG/ML IJ SOLN
100.0000 mg | Freq: Once | INTRAMUSCULAR | Status: AC
  Administered 2021-08-04: 100 mg via INTRAMUSCULAR
  Filled 2021-08-04: qty 2

## 2021-08-04 MED ORDER — CHLORDIAZEPOXIDE HCL 25 MG PO CAPS
25.0000 mg | ORAL_CAPSULE | Freq: Three times a day (TID) | ORAL | Status: DC
  Filled 2021-08-04: qty 1

## 2021-08-04 MED ORDER — CHLORDIAZEPOXIDE HCL 25 MG PO CAPS
25.0000 mg | ORAL_CAPSULE | Freq: Four times a day (QID) | ORAL | Status: DC
  Administered 2021-08-04 – 2021-08-05 (×3): 25 mg via ORAL
  Filled 2021-08-04 (×3): qty 1

## 2021-08-04 MED ORDER — THIAMINE HCL 100 MG PO TABS
100.0000 mg | ORAL_TABLET | Freq: Every day | ORAL | Status: DC
  Administered 2021-08-05 – 2021-08-06 (×2): 100 mg via ORAL
  Filled 2021-08-04 (×2): qty 1

## 2021-08-04 MED ORDER — SODIUM CHLORIDE 0.9 % IV SOLN
1.0000 g | Freq: Three times a day (TID) | INTRAVENOUS | Status: AC
  Administered 2021-08-04 – 2021-08-06 (×5): 1 g via INTRAVENOUS
  Filled 2021-08-04 (×6): qty 1

## 2021-08-04 MED ORDER — ACETAMINOPHEN 650 MG RE SUPP: 650.0000 mg | Freq: Four times a day (QID) | RECTAL | Status: DC | PRN

## 2021-08-04 MED ORDER — KETOROLAC TROMETHAMINE 15 MG/ML IJ SOLN
15.0000 mg | Freq: Four times a day (QID) | INTRAMUSCULAR | Status: DC | PRN
  Administered 2021-08-04 – 2021-08-06 (×4): 15 mg via INTRAVENOUS
  Filled 2021-08-04 (×4): qty 1

## 2021-08-04 NOTE — ED Provider Notes (Signed)
[facility] EMERGENCY DEPARTMENT Provider Note   CSN: [id number] Arrival date & time: 08/03/21  2224     History Chief Complaint  Patient presents with   Elbow Pain    Caleb Bush is a 40 y.o. male.  The history is provided by the patient.  Illness Location:  LUE Quality:  Swelling redness and warmth Severity:  Severe Onset quality:  Gradual Duration: days. Timing:  Constant Progression:  Worsening Chronicity:  New Context:  IVDA Relieved by:  Nothing Worsened by:  Nothing Ineffective treatments:  None Associated symptoms: no fever, no rhinorrhea, no shortness of breath, no vomiting and no wheezing   Risk factors:  IVDA     Past Medical History:  Diagnosis Date   GERD (gastroesophageal reflux disease)    Hepatitis C    History of substance abuse (HCC)    Mandible fracture (HCC) 07/18/2017   Teeth missing     Patient Active Problem List   Diagnosis Date Noted   Bilateral fracture of mandible, open, initial encounter (HCC) 07/17/2017   Heroin abuse (HCC) 04/06/2014   Polysubstance abuse (HCC) 04/06/2014   Altered mental status 04/06/2014   Hepatitis, chronic (HCC) 04/06/2014   Acute respiratory failure with hypoxia (HCC) 04/06/2014    Past Surgical History:  Procedure Laterality Date   CLOSED REDUCTION MANDIBLE Right 07/18/2017   Procedure: CLOSED REDUCTION CONDYLAR FRACTURE;  Surgeon: Vivia Ewing, DMD;  Location: [facility];  Service: Oral Surgery;  Laterality: Right;   HIP SURGERY     MANDIBULAR HARDWARE REMOVAL N/A 09/15/2017   Procedure: MANDIBULAR HARDWARE REMOVAL;  Surgeon: Vivia Ewing, DMD;  Location: [facility];  Service: Oral Surgery;  Laterality: N/A;   MASS EXCISION Left 09/15/2017   Procedure: BIOPSY OF TISSUE LEFT MANDIBLE;  Surgeon: Vivia Ewing, DMD;  Location: [facility];  Service: Oral Surgery;  Laterality: Left;   MULTIPLE EXTRACTIONS WITH ALVEOLOPLASTY Bilateral 07/18/2017   Procedure: MULTIPLE  EXTRACTION OF # 1, 14, 19, 30 & 31;  Surgeon: Vivia Ewing, DMD;  Location: [facility];  Service: Oral Surgery;  Laterality: Bilateral;   ORIF MANDIBULAR FRACTURE Left 07/18/2017   Procedure: OPEN REDUCTION INTERNAL FIXATION (ORIF) MANDIBULAR FRACTURE;  Surgeon: Vivia Ewing, DMD;  Location: [facility];  Service: Oral Surgery;  Laterality: Left;       History reviewed. No pertinent family history.  Social History   Tobacco Use   Smoking status: Every Day    Packs/day: 0.00    Types: Cigarettes   Smokeless tobacco: Never  Vaping Use   Vaping Use: Never used  Substance Use Topics   Alcohol use: No   Drug use: Not Currently    Types: IV    Home Medications Prior to Admission medications   Medication Sig Start Date End Date Taking? Authorizing Provider  clindamycin (CLEOCIN) 300 MG capsule Take 1 capsule (300 mg total) by mouth 4 (four) times daily. X 7 days 08/04/21  Yes Nosson Wender, MD  acetaminophen (TYLENOL) 325 MG tablet Take 650 mg by mouth every 6 (six) hours as needed for mild pain.    [provider]  amLODipine (NORVASC) 5 MG tablet Take 1 tablet (5 mg total) by mouth daily. 12/05/20   Linwood Dibbles, MD  chlorhexidine (PERIDEX) 0.12 % solution Use as directed 15 mLs in the mouth or throat 3 (three) times daily.    [provider]  Nutritional Supplements (ENSURE PLUS PO) Take 2 Bottles by mouth 3 (three) times daily.  [provider]  omeprazole (PRILOSEC) 20 MG capsule Take 20 mg by mouth daily.    [provider]    Allergies    Clonidine derivatives, Ivp dye [iodinated diagnostic agents], and Penicillins  Review of Systems   Review of Systems  Constitutional:  Negative for fever.  HENT:  Negative for facial swelling and rhinorrhea.   Eyes:  Negative for redness.  Respiratory:  Negative for shortness of breath and wheezing.   Gastrointestinal:  Negative for vomiting.  Genitourinary:  Negative for difficulty urinating.  Musculoskeletal:   Positive for arthralgias and joint swelling.  Skin:  Negative for color change.  Neurological:  Negative for facial asymmetry.  Psychiatric/Behavioral:  Negative for agitation.   All other systems reviewed and are negative.  Physical Exam Updated Vital Signs BP (!) 142/82   Pulse 97   Temp 98.7 F (37.1 C)   Resp 16   SpO2 100%   Physical Exam Vitals and nursing note reviewed.  Constitutional:      General: He is not in acute distress.    Appearance: Normal appearance.  HENT:     Head: Normocephalic and atraumatic.     Nose: Nose normal.  Eyes:     Conjunctiva/sclera: Conjunctivae normal.     Pupils: Pupils are equal, round, and reactive to light.  Cardiovascular:     Rate and Rhythm: Normal rate and regular rhythm.     Pulses: Normal pulses.     Heart sounds: Normal heart sounds.  Pulmonary:     Effort: Pulmonary effort is normal.     Breath sounds: Normal breath sounds.  Abdominal:     General: Abdomen is flat. Bowel sounds are normal.     Palpations: Abdomen is soft.     Tenderness: There is no abdominal tenderness. There is no guarding.  Musculoskeletal:     Cervical back: Normal range of motion and neck supple.  Skin:    Capillary Refill: Capillary refill takes less than 2 seconds.     Findings: Erythema present.       Neurological:     General: No focal deficit present.     Mental Status: He is alert and oriented to person, place, and time.     Deep Tendon Reflexes: Reflexes normal.  Psychiatric:        Mood and Affect: Mood normal.        Behavior: Behavior normal.    ED Results / Procedures / Treatments   Labs (all labs ordered are listed, but only abnormal results are displayed) Labs Reviewed  CULTURE, BLOOD (ROUTINE X 2)  CULTURE, BLOOD (ROUTINE X 2)  RESP PANEL BY RT-PCR (FLU A&B, COVID) ARPGX2  CBC WITH DIFFERENTIAL/PLATELET  COMPREHENSIVE METABOLIC PANEL    EKG None  Radiology No results found.  Procedures Procedures   Medications  Ordered in ED Medications  clindamycin (CLEOCIN) IVPB 900 mg (has no administration in time range)    ED Course  I have reviewed the triage vital signs and the nursing notes.  Pertinent labs & imaging results that were available during my care of the patient were reviewed by me and considered in my medical decision making (see chart for details).   Patient left AMA as his girlfriend could not operate the scooter they drove here on.  Needs admission for IV antibiotics.  RX for intention to treat.    Argie Applegate was evaluated in Emergency Department on 08/04/2021 for the symptoms described in the history of present illness.  He was evaluated in the context of the global COVID-19 pandemic, which necessitated consideration that the patient might be at risk for infection with the SARS-CoV-2 virus that causes COVID-19. Institutional protocols and algorithms that pertain to the evaluation of patients at risk for COVID-19 are in a state of rapid change based on information released by regulatory bodies including the CDC and federal and state organizations. These policies and algorithms were followed during the patient's care in the ED.  Final Clinical Impression(s) / ED Diagnoses Final diagnoses:  Cellulitis of left upper extremity    Rx / DC Orders ED Discharge Orders          Ordered    clindamycin (CLEOCIN) 300 MG capsule  4 times daily        08/04/21 0116             Kenlyn Lose, MD 08/04/21 0216

## 2021-08-04 NOTE — ED Provider Notes (Signed)
[facility] EMERGENCY DEPARTMENT Provider Note   CSN: [id number] Arrival date & time: 08/04/21  1611     History Chief Complaint  Patient presents with   Wound Infection    Caleb Bush is a 40 y.o. male.  Patient is a 40 year old male who presents with an infection to his right arm.  Says it started 3 days ago.  He denies any fevers.  He denies any known injury.  He does say he was sleeping outside and then noticed the redness and then started scrubbing it with a brush.  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  He has had prior history of osteomyelitis.  He denies any injections into this arm.  No chest pain or shortness of breath.  No known fevers.      Past Medical History:  Diagnosis Date   GERD (gastroesophageal reflux disease)    Hepatitis C    History of substance abuse (HCC)    Mandible fracture (HCC) 07/18/2017   Teeth missing     Patient Active Problem List   Diagnosis Date Noted   Cellulitis of left upper extremity 08/04/2021   Bilateral fracture of mandible, open, initial encounter (HCC) 07/17/2017   Heroin abuse (HCC) 04/06/2014   Polysubstance abuse (HCC) 04/06/2014   Altered mental status 04/06/2014   Hepatitis, chronic (HCC) 04/06/2014   Acute respiratory failure with hypoxia (HCC) 04/06/2014    Past Surgical History:  Procedure Laterality Date   CLOSED REDUCTION MANDIBLE Right 07/18/2017   Procedure: CLOSED REDUCTION CONDYLAR FRACTURE;  Surgeon: Vivia Ewing, DMD;  Location: [facility];  Service: Oral Surgery;  Laterality: Right;   HIP SURGERY     MANDIBULAR HARDWARE REMOVAL N/A 09/15/2017   Procedure: MANDIBULAR HARDWARE REMOVAL;  Surgeon: Vivia Ewing, DMD;  Location: [facility];  Service: Oral Surgery;  Laterality: N/A;   MASS EXCISION Left 09/15/2017   Procedure: BIOPSY OF TISSUE LEFT MANDIBLE;  Surgeon: Vivia Ewing, DMD;  Location: [facility];  Service: Oral Surgery;   Laterality: Left;   MULTIPLE EXTRACTIONS WITH ALVEOLOPLASTY Bilateral 07/18/2017   Procedure: MULTIPLE EXTRACTION OF # 1, 14, 19, 30 & 31;  Surgeon: Vivia Ewing, DMD;  Location: [facility];  Service: Oral Surgery;  Laterality: Bilateral;   ORIF MANDIBULAR FRACTURE Left 07/18/2017   Procedure: OPEN REDUCTION INTERNAL FIXATION (ORIF) MANDIBULAR FRACTURE;  Surgeon: Vivia Ewing, DMD;  Location: [facility];  Service: Oral Surgery;  Laterality: Left;       No family history on file.  Social History   Tobacco Use   Smoking status: Every Day    Packs/day: 0.00    Types: Cigarettes   Smokeless tobacco: Never  Vaping Use   Vaping Use: Never used  Substance Use Topics   Alcohol use: No   Drug use: Not Currently    Types: IV    Home Medications Prior to Admission medications   Medication Sig Start Date End Date Taking? Authorizing Provider  amLODipine (NORVASC) 5 MG tablet Take 1 tablet (5 mg total) by mouth daily. Patient not taking: Reported on 08/04/2021 12/05/20   Linwood Dibbles, MD  clindamycin (CLEOCIN) 300 MG capsule Take 1 capsule (300 mg total) by mouth 4 (four) times daily. X 7 days Patient not taking: No sig reported 08/04/21   Palumbo, April, MD    Allergies    Clonidine derivatives, Ivp dye [iodinated diagnostic agents], and Penicillins  Review of Systems   Review  of Systems  Constitutional:  Negative for chills, diaphoresis, fatigue and fever.  HENT:  Negative for congestion, rhinorrhea and sneezing.   Eyes: Negative.   Respiratory:  Negative for cough, chest tightness and shortness of breath.   Cardiovascular:  Negative for chest pain and leg swelling.  Gastrointestinal:  Negative for abdominal pain, blood in stool, diarrhea, nausea and vomiting.  Genitourinary:  Negative for difficulty urinating, flank pain, frequency and hematuria.  Musculoskeletal:  Positive for arthralgias. Negative for back pain.  Skin:  Positive for wound. Negative for rash.  Neurological:  Negative for  dizziness, speech difficulty, weakness, numbness and headaches.   Physical Exam Updated Vital Signs BP 139/87   Pulse 77   Temp 98.1 F (36.7 C)   Resp 11   Ht 5\' 11"  (1.803 m)   Wt 72.6 kg   SpO2 100%   BMI 22.32 kg/m   Physical Exam Constitutional:      Appearance: He is well-developed.  HENT:     Head: Normocephalic and atraumatic.  Eyes:     Pupils: Pupils are equal, round, and reactive to light.  Cardiovascular:     Rate and Rhythm: Normal rate and regular rhythm.     Heart sounds: Normal heart sounds.  Pulmonary:     Effort: Pulmonary effort is normal. No respiratory distress.     Breath sounds: Normal breath sounds. No wheezing or rales.  Chest:     Chest wall: No tenderness.  Abdominal:     General: Bowel sounds are normal.     Palpations: Abdomen is soft.     Tenderness: There is no abdominal tenderness. There is no guarding or rebound.  Musculoskeletal:        General: Normal range of motion.     Cervical back: Normal range of motion and neck supple.     Comments: Patient with marked swelling and redness to the left arm, stemming from the elbow area.  At the elbow there is a quarter sized abscess with yellow drainage.  There is surrounding induration encompassing most of the elbow area.  He has pain with movement of the elbow.  He has normal sensation, motor function and pulses distally.  Lymphadenopathy:     Cervical: No cervical adenopathy.  Skin:    General: Skin is warm and dry.     Findings: No rash.  Neurological:     Mental Status: He is alert and oriented to person, place, and time.       ED Results / Procedures / Treatments   Labs (all labs ordered are listed, but only abnormal results are displayed) Labs Reviewed  CBC WITH DIFFERENTIAL/PLATELET - Abnormal; Notable for the following components:      Result Value   WBC 12.2 (*)    Neutro Abs 10.1 (*)    All other components within normal limits  COMPREHENSIVE METABOLIC PANEL - Abnormal;  Notable for the following components:   Glucose, Bld 100 (*)    Creatinine, Ser 0.57 (*)    Albumin 3.3 (*)    All other components within normal limits  RESP PANEL BY RT-PCR (FLU A&B, COVID) ARPGX2  CULTURE, BLOOD (ROUTINE X 2)  CULTURE, BLOOD (ROUTINE X 2)  LACTIC ACID, PLASMA  ETHANOL  LACTIC ACID, PLASMA  RAPID URINE DRUG SCREEN, HOSP PERFORMED    EKG EKG Interpretation  Date/Time:  Saturday August 04 2021 18:12:45 EDT Ventricular Rate:  88 PR Interval:  142 QRS Duration: 108 QT Interval:  388 QTC Calculation: 470  R Axis:   -9 Text Interpretation: Sinus rhythm since last tracing no significant change Confirmed by Malvin Johns [phone]) on 08/04/2021 7:07:29 PM  Radiology DG Chest 2 View  Result Date: 08/04/2021 CLINICAL DATA:  Arm infection. EXAM: CHEST - 2 VIEW COMPARISON:  April 15, 2018 FINDINGS: The heart size and mediastinal contours are within normal limits. Both lungs are clear. The visualized skeletal structures are unremarkable. IMPRESSION: No active cardiopulmonary disease. Electronically Signed   By: Fidela Salisbury M.D.   On: 08/04/2021 19:07   DG Elbow Complete Left  Result Date: 08/04/2021 CLINICAL DATA:  Arm infection. EXAM: LEFT ELBOW - COMPLETE 3+ VIEW COMPARISON:  None. FINDINGS: There is no evidence of fracture, dislocation, or joint effusion. There is no evidence of arthropathy or other focal bone abnormality. Diffuse soft tissue swelling. IMPRESSION: 1. Diffuse soft tissue swelling. 2. No radiographic evidence of osteomyelitis. Electronically Signed   By: Fidela Salisbury M.D.   On: 08/04/2021 19:07   CT FOREARM LEFT WO CONTRAST  Result Date: 08/04/2021 CLINICAL DATA:  Diffuse edema, suspected infectious etiology, from the distal left upper arm through the forearm and wrist. EXAM: CT OF THE LEFT FOREARM WITHOUT CONTRAST TECHNIQUE: Multidetector CT imaging was performed according to the standard protocol. Multiplanar CT image reconstructions were  also generated. COMPARISON:  Left elbow series earlier today's the only relevant prior FINDINGS: Bones/Joint/Cartilage There is normal bone mineralization without evidence of fracture, dislocation or destructive bone lesion. All joint spaces are maintained at the elbow and wrist. Ligaments Suboptimally assessed by CT. No bony distraction is seen which would suggest an obvious ligamentous injury. Muscles and Tendons Also not well evaluated with non contrasted CT. There are no obvious secondary signs of acute ligamentous injury. Edema suspected in the muscle groups of the visualized distal upper arm and forearm marked by loss of distinction of fat planes between the muscles. Soft tissues There is diffuse edema in the distal upper arm, at the level the elbow and throughout the forearm and wrist, as above also probably involves the muscle groups with loss of definition of intermuscular fat planes. There is scattered nonlocalizing fluid seen along the fat muscle interfaces in the distal upper arm and forearm, but there is no localized fluid collection, abscess or soft tissue gas. Nevertheless, a necrotizing fasciitis must be considered in the differential diagnosis. No radiopaque foreign body is seen. IMPRESSION: 1. Diffuse edema extending from the distal upper arm through the elbow and forearm to the wrist. This also appears to involve the muscle groups. There is no soft tissue gas, but early necrotizing fasciitis could have this appearance as well as diffuse cellulitis/myositis. 2. No destructive bone lesion or erosive arthropathy is seen. There is no radiopaque foreign body. 3. Nonlocalizing fluid along the fat muscle interfaces is present but no focal fluid collection is visible. Electronically Signed   By: Telford Nab M.D.   On: 08/04/2021 20:37    Procedures Procedures   Medications Ordered in ED Medications  vancomycin (VANCOREADY) IVPB 1500 mg/300 mL (1,500 mg Intravenous New Bag/Given 08/04/21 2023)   acetaminophen (TYLENOL) tablet 1,000 mg (1,000 mg Oral Patient Refused/Not Given 08/04/21 2025)    ED Course  I have reviewed the triage vital signs and the nursing notes.  Pertinent labs & imaging results that were available during my care of the patient were reviewed by me and considered in my medical decision making (see chart for details).    MDM Rules/Calculators/A&P  Patient is a 40 year old male who presents with infection to his arm.  He has a draining abscess over his olecranon.  Labs show an elevated WBC count.  His lactate is normal.  He is afebrile.  No other suggestions of sepsis.  He had a CT scan which showed diffuse cellulitis including the muscle.  There was some concern for necrotizing fasciitis but there was no gas in the tissues.  I spoke with Dr. Griffin Basil with orthopedic surgery.  He felt that it was a low likelihood of necrotizing fasciitis and that the patient can be admitted to the hospitalist service.  Patient was started on vancomycin.  I spoke with Dr. Hal Hope who will admit the patient for further treatment. Final Clinical Impression(s) / ED Diagnoses Final diagnoses:  Cellulitis of left upper extremity    Rx / DC Orders ED Discharge Orders     None        Malvin Johns, MD 08/04/21 2149

## 2021-08-04 NOTE — ED Triage Notes (Signed)
Pt reports swelling and redness to his left elbow, hx of IV drug use, last use last night. Whole left arm swollen. Pt seen at [facility] but pt did not stay for admission due to transportation issues

## 2021-08-04 NOTE — ED Notes (Signed)
Pt transported to CT ?

## 2021-08-04 NOTE — ED Notes (Signed)
Pt difficult stick  only one blood tube was gotten

## 2021-08-04 NOTE — H&P (Signed)
History and Physical    Caleb Bush WFU:[id number] DOB: [birth year] DOA: 08/04/2021  PCP: System, Provider Not In  Patient coming from: Home.  Chief Complaint: Left upper extremity pain and swelling.  HPI: Caleb Bush is a 40 y.o. male with history of IV drug abuse, hypertension presents to the ER because of worsening swelling of the left upper extremity.  Patient states his swelling started about 3 to 4 days ago around the left elbow area which is gradually progressed proximally and distally involving his left arm and left forearm.  He had a small pustule like area which she tried to incise himself.  Patient admits to have taken IV drug in the last 24 hours.  But denies having injection on his arm.  Has been having subjective feeling of fever chills.  Patient has had prior history of septic arthritis involving the both hips and also had underwent surgery.  Patient states that he has had his right proximal femur removed and is mostly wheelchair-bound.  Denies any chest pain or shortness of breath.  ED Course: In the ER patient had CT scan of the left upper extremity which shows diffuse soft tissue swelling with no definite abscess found.  ER physician discussed with on-call orthopedic surgeon Dr. Everardo Pacific with this time advised no definite indication for surgery but to start IV antibiotics and closely monitor.  Blood cultures obtained and started on empiric antibiotics.  Labs show WBC count of 12.2 lactic acid 1.3 metabolic panel was largely unremarkable.  COVID test was negative.  EKG shows normal sinus rhythm.  Review of Systems: As per HPI, rest all negative.   Past Medical History:  Diagnosis Date   GERD (gastroesophageal reflux disease)    Hepatitis C    History of substance abuse (HCC)    Mandible fracture (HCC) 07/18/2017   Teeth missing     Past Surgical History:  Procedure Laterality Date   CLOSED REDUCTION MANDIBLE Right 07/18/2017   Procedure: CLOSED REDUCTION  CONDYLAR FRACTURE;  Surgeon: Vivia Ewing, DMD;  Location: [facility];  Service: Oral Surgery;  Laterality: Right;   HIP SURGERY     MANDIBULAR HARDWARE REMOVAL N/A 09/15/2017   Procedure: MANDIBULAR HARDWARE REMOVAL;  Surgeon: Vivia Ewing, DMD;  Location: [facility];  Service: Oral Surgery;  Laterality: N/A;   MASS EXCISION Left 09/15/2017   Procedure: BIOPSY OF TISSUE LEFT MANDIBLE;  Surgeon: Vivia Ewing, DMD;  Location: [facility];  Service: Oral Surgery;  Laterality: Left;   MULTIPLE EXTRACTIONS WITH ALVEOLOPLASTY Bilateral 07/18/2017   Procedure: MULTIPLE EXTRACTION OF # 1, 14, 19, 30 & 31;  Surgeon: Vivia Ewing, DMD;  Location: [facility] OR;  Service: Oral Surgery;  Laterality: Bilateral;   ORIF MANDIBULAR FRACTURE Left 07/18/2017   Procedure: OPEN REDUCTION INTERNAL FIXATION (ORIF) MANDIBULAR FRACTURE;  Surgeon: Vivia Ewing, DMD;  Location: [facility];  Service: Oral Surgery;  Laterality: Left;     reports that he has been smoking cigarettes. He has never used smokeless tobacco. He reports that he does not currently use drugs after having used the following drugs: IV. He reports that he does not drink alcohol.  Allergies  Allergen Reactions   Clonidine Derivatives Other (See Comments)    UNKNOWN   Ivp Dye [Iodinated Diagnostic Agents] Other (See Comments)    UNKNOWN   Penicillins Hives    History reviewed. No pertinent family history.  Prior to Admission medications   Medication Sig Start Date End Date Taking? Authorizing Provider  amLODipine (NORVASC) 5 MG tablet Take 1 tablet (5 mg total) by mouth daily. Patient not taking: Reported on 08/04/2021 12/05/20   Dorie Rank, MD  clindamycin (CLEOCIN) 300 MG capsule Take 1 capsule (300 mg total) by mouth 4 (four) times daily. X 7 days Patient not taking: No sig reported 08/04/21   Randal Buba, April, MD    Physical Exam: Constitutional: Moderately built and nourished. Vitals:   08/04/21 1930 08/04/21 2000 08/04/21 2030  08/04/21 2210  BP: 134/78 132/81 139/87 139/83  Pulse: 82 79 77 86  Resp: 10  11 (!) 9  Temp:      SpO2: 99%  100% 100%  Weight:      Height:       Eyes: Anicteric no pallor. ENMT: No discharge from the ears eyes nose and mouth. Neck: No mass felt.  No neck rigidity. Respiratory: No rhonchi or crepitations. Cardiovascular: S1-S2 heard. Abdomen: Soft nontender bowel sound present. Musculoskeletal: Left upper extremity swollen from the fingers up to his left mid arm.  He is able to move his arm and has good sensation and pulses.  But is quite swollen but not able to make a fist. Skin: Rash of the left upper extremity.  He has difficulty moving his right lower extremity because patient states he had surgery previously. Neurologic: Alert awake oriented time place and person. Psychiatric: Appears normal.  Normal affect.   Labs on Admission: I have personally reviewed following labs and imaging studies  CBC: Recent Labs  Lab 08/04/21 1718  WBC 12.2*  NEUTROABS 10.1*  HGB 13.2  HCT 41.2  MCV 88.2  PLT 123XX123   Basic Metabolic Panel: Recent Labs  Lab 08/04/21 1718  NA 139  K 3.9  CL 104  CO2 28  GLUCOSE 100*  BUN 9  CREATININE 0.57*  CALCIUM 9.0   GFR: Estimated Creatinine Clearance: 123.5 mL/min (A) (by C-G formula based on SCr of 0.57 mg/dL (L)). Liver Function Tests: Recent Labs  Lab 08/04/21 1718  AST 39  ALT 31  ALKPHOS 91  BILITOT 0.5  PROT 7.4  ALBUMIN 3.3*   No results for input(s): LIPASE, AMYLASE in the last 168 hours. No results for input(s): AMMONIA in the last 168 hours. Coagulation Profile: No results for input(s): INR, PROTIME in the last 168 hours. Cardiac Enzymes: No results for input(s): CKTOTAL, CKMB, CKMBINDEX, TROPONINI in the last 168 hours. BNP (last 3 results) No results for input(s): PROBNP in the last 8760 hours. HbA1C: No results for input(s): HGBA1C in the last 72 hours. CBG: No results for input(s): GLUCAP in the last 168  hours. Lipid Profile: No results for input(s): CHOL, HDL, LDLCALC, TRIG, CHOLHDL, LDLDIRECT in the last 72 hours. Thyroid Function Tests: No results for input(s): TSH, T4TOTAL, FREET4, T3FREE, THYROIDAB in the last 72 hours. Anemia Panel: No results for input(s): VITAMINB12, FOLATE, FERRITIN, TIBC, IRON, RETICCTPCT in the last 72 hours. Urine analysis:    Component Value Date/Time   COLORURINE COLORLESS (A) 12/15/2008 2016   APPEARANCEUR CLEAR 12/15/2008 2016   LABSPEC 1.005 12/15/2008 2016   PHURINE 6.5 12/15/2008 2016   GLUCOSEU NEGATIVE 12/15/2008 2016   HGBUR NEGATIVE 12/15/2008 2016   [place] NEGATIVE 12/15/2008 2016   KETONESUR NEGATIVE 12/15/2008 2016   PROTEINUR NEGATIVE 12/15/2008 2016   UROBILINOGEN 0.2 12/15/2008 2016   NITRITE NEGATIVE 12/15/2008 2016   LEUKOCYTESUR  12/15/2008 2016    NEGATIVE MICROSCOPIC NOT DONE ON URINES WITH NEGATIVE PROTEIN, BLOOD, LEUKOCYTES, NITRITE, OR GLUCOSE <1000 mg/dL.  Sepsis Labs: @LABRCNTIP (procalcitonin:4,lacticidven:4) ) Recent Results (from the past 240 hour(s))  Resp Panel by RT-PCR (Flu A&B, Covid) Nasopharyngeal Swab     Status: None   Collection Time: 08/04/21  8:31 PM   Specimen: Nasopharyngeal Swab; Nasopharyngeal(NP) swabs in vial transport medium  Result Value Ref Range Status   SARS Coronavirus 2 by RT PCR NEGATIVE NEGATIVE Final    Comment: (NOTE) SARS-CoV-2 target nucleic acids are NOT DETECTED.  The SARS-CoV-2 RNA is generally detectable in upper respiratory specimens during the acute phase of infection. The lowest concentration of SARS-CoV-2 viral copies this assay can detect is 138 copies/mL. A negative result does not preclude SARS-Cov-2 infection and should not be used as the sole basis for treatment or other patient management decisions. A negative result may occur with  improper specimen collection/handling, submission of specimen other than nasopharyngeal swab, presence of viral mutation(s) within  the areas targeted by this assay, and inadequate number of viral copies(<138 copies/mL). A negative result must be combined with clinical observations, patient history, and epidemiological information. The expected result is Negative.  Fact Sheet for Patients:  EntrepreneurPulse.com.au  Fact Sheet for Healthcare Providers:  IncredibleEmployment.be  This test is no t yet approved or cleared by the [place] FDA and  has been authorized for detection and/or diagnosis of SARS-CoV-2 by FDA under an Emergency Use Authorization (EUA). This EUA will remain  in effect (meaning this test can be used) for the duration of the COVID-19 declaration under Section 564(b)(1) of the Act, 21 U.S.C.section 360bbb-3(b)(1), unless the authorization is terminated  or revoked sooner.       Influenza A by PCR NEGATIVE NEGATIVE Final   Influenza B by PCR NEGATIVE NEGATIVE Final    Comment: (NOTE) The Xpert Xpress SARS-CoV-2/FLU/RSV plus assay is intended as an aid in the diagnosis of influenza from Nasopharyngeal swab specimens and should not be used as a sole basis for treatment. Nasal washings and aspirates are unacceptable for Xpert Xpress SARS-CoV-2/FLU/RSV testing.  Fact Sheet for Patients: EntrepreneurPulse.com.au  Fact Sheet for Healthcare Providers: IncredibleEmployment.be  This test is not yet approved or cleared by the [place] FDA and has been authorized for detection and/or diagnosis of SARS-CoV-2 by FDA under an Emergency Use Authorization (EUA). This EUA will remain in effect (meaning this test can be used) for the duration of the COVID-19 declaration under Section 564(b)(1) of the Act, 21 U.S.C. section 360bbb-3(b)(1), unless the authorization is terminated or revoked.  Performed at [facility], [place] [address], [place], [place] [address]      Radiological Exams on Admission: DG Chest 2  View  Result Date: 08/04/2021 CLINICAL DATA:  Arm infection. EXAM: CHEST - 2 VIEW COMPARISON:  April 15, 2018 FINDINGS: The heart size and mediastinal contours are within normal limits. Both lungs are clear. The visualized skeletal structures are unremarkable. IMPRESSION: No active cardiopulmonary disease. Electronically Signed   By: Fidela Salisbury M.D.   On: 08/04/2021 19:07   DG Elbow Complete Left  Result Date: 08/04/2021 CLINICAL DATA:  Arm infection. EXAM: LEFT ELBOW - COMPLETE 3+ VIEW COMPARISON:  None. FINDINGS: There is no evidence of fracture, dislocation, or joint effusion. There is no evidence of arthropathy or other focal bone abnormality. Diffuse soft tissue swelling. IMPRESSION: 1. Diffuse soft tissue swelling. 2. No radiographic evidence of osteomyelitis. Electronically Signed   By: Fidela Salisbury M.D.   On: 08/04/2021 19:07   CT FOREARM LEFT WO CONTRAST  Result Date: 08/04/2021 CLINICAL DATA:  Diffuse edema, suspected infectious etiology, from the distal left upper arm through the forearm and wrist. EXAM: CT OF THE LEFT FOREARM WITHOUT CONTRAST TECHNIQUE: Multidetector CT imaging was performed according to the standard protocol. Multiplanar CT image reconstructions were also generated. COMPARISON:  Left elbow series earlier today's the only relevant prior FINDINGS: Bones/Joint/Cartilage There is normal bone mineralization without evidence of fracture, dislocation or destructive bone lesion. All joint spaces are maintained at the elbow and wrist. Ligaments Suboptimally assessed by CT. No bony distraction is seen which would suggest an obvious ligamentous injury. Muscles and Tendons Also not well evaluated with non contrasted CT. There are no obvious secondary signs of acute ligamentous injury. Edema suspected in the muscle groups of the visualized distal upper arm and forearm marked by loss of distinction of fat planes between the muscles. Soft tissues There is diffuse edema in the  distal upper arm, at the level the elbow and throughout the forearm and wrist, as above also probably involves the muscle groups with loss of definition of intermuscular fat planes. There is scattered nonlocalizing fluid seen along the fat muscle interfaces in the distal upper arm and forearm, but there is no localized fluid collection, abscess or soft tissue gas. Nevertheless, a necrotizing fasciitis must be considered in the differential diagnosis. No radiopaque foreign body is seen. IMPRESSION: 1. Diffuse edema extending from the distal upper arm through the elbow and forearm to the wrist. This also appears to involve the muscle groups. There is no soft tissue gas, but early necrotizing fasciitis could have this appearance as well as diffuse cellulitis/myositis. 2. No destructive bone lesion or erosive arthropathy is seen. There is no radiopaque foreign body. 3. Nonlocalizing fluid along the fat muscle interfaces is present but no focal fluid collection is visible. Electronically Signed   By: Telford Nab M.D.   On: 08/04/2021 20:37    EKG: Independently reviewed.  Normal sinus rhythm.  Assessment/Plan Principal Problem:   Cellulitis of left upper extremity Active Problems:   Heroin abuse (HCC)   Cellulitis    Cellulitis of the left upper extremity involving the left forearm and elbow and mid arm on the left side with history of IV drug abuse for which patient had blood cultures drawn and started on empiric antibiotics.  Orthopedic surgeon Dr. Griffin Basil was consulted at this time orthopedic surgery advised antibiotics and observation.  We will follow blood cultures.  Closely monitor for any further worsening of patient's symptoms empirically for any compartment syndrome or signs of necrotizing fasciitis. IV drug abuse advised about quitting.  Discussed with pharmacy and placed patient on Librium protocol for withdrawal.  Patient does not want to be on Suboxone. Hypertension presently not taking  medication.  We will keep patient n.p.o. and IV hydralazine for blood pressure trends.  Since patient has significant involvement of the left upper extremity with infection will need close monitoring for any further worsening inpatient status.   DVT prophylaxis: SCDs.  We will hold off anticoagulation until make sure there is no surgeries planned. Code Status: Full code. Family Communication: Discussed with patient. Disposition Plan: To be determined. Consults called: ER physician discussed with orthopedic surgeon. Admission status: Inpatient.   Rise Patience MD [facility] Pager [phone].  If 7PM-7AM, please contact night-coverage [email or web address] Password [facility]  08/04/2021, 10:17 PM

## 2021-08-04 NOTE — ED Notes (Signed)
Pt understands he needs to be admitted, but wants to drive his scooter home, then go by POV to another hospital and get admitted. Understands the risks of leaving AMA

## 2021-08-04 NOTE — ED Provider Notes (Signed)
Emergency Medicine Provider Triage Evaluation Note  Caleb Bush , a 40 y.o. male  was evaluated in triage.  Pt complains of infection on the left arm. briefly patient was seen at [facility] earlier today, left AMA.  Patient endorses IV drug use, most recently last night. History of MRSA, denies history of endorcarditis. Multiple osteomyelitis with hip surgery femur surgery in past.  Patient has not been injecting in the affected arm.  Patient reports that infection, redness, swelling began 4 days ago.  Patient does endorse numbness in his left arm, patient also endorses some chills, nausea, lightheadedness.  No chest pain, no shortness of breath..  Review of Systems  Positive: As above Negative: As above  Physical Exam  BP 140/84 (BP Location: Right Arm)   Pulse 86   Temp 98.1 F (36.7 C)   Resp 18   Ht 5\' 11"  (1.803 m)   Wt 72.6 kg   SpO2 100%   BMI 22.32 kg/m  Gen:   Awake, no distress   Resp:  Normal effort  MSK:   Moves extremities without difficulty, left arm with significant redness and swelling extending from elbow towards wrist Other:  Purulent red fluctuant swelling at left arm  Medical Decision Making  Medically screening exam initiated at 5:18 PM.  Appropriate orders placed.  Vane Yapp was informed that the remainder of the evaluation will be completed by another provider, this initial triage assessment does not replace that evaluation, and the importance of remaining in the ED until their evaluation is complete.  Arm infection, IVDU, hx of osteo   Mamie Laurel, PA-C 08/04/21 1720    13/05/22, MD 08/04/21 1758

## 2021-08-04 NOTE — Progress Notes (Signed)
ER called about right arm cellulitis on this patient. No drainable fluid collection on ct, no gas concerning for necrotizing fasciitis, recommend antibiotics per medical team discretion and consult if surgical concern develops.

## 2021-08-05 ENCOUNTER — Other Ambulatory Visit: Payer: Self-pay

## 2021-08-05 DIAGNOSIS — I10 Essential (primary) hypertension: Secondary | ICD-10-CM

## 2021-08-05 DIAGNOSIS — R262 Difficulty in walking, not elsewhere classified: Secondary | ICD-10-CM

## 2021-08-05 DIAGNOSIS — F1990 Other psychoactive substance use, unspecified, uncomplicated: Secondary | ICD-10-CM

## 2021-08-05 DIAGNOSIS — F1110 Opioid abuse, uncomplicated: Secondary | ICD-10-CM

## 2021-08-05 DIAGNOSIS — R5381 Other malaise: Secondary | ICD-10-CM

## 2021-08-05 LAB — CBC
HCT: 36.4 % — ABNORMAL LOW (ref 39.0–52.0)
Hemoglobin: 11.4 g/dL — ABNORMAL LOW (ref 13.0–17.0)
MCH: 27.7 pg (ref 26.0–34.0)
MCHC: 31.3 g/dL (ref 30.0–36.0)
MCV: 88.3 fL (ref 80.0–100.0)
Platelets: 237 10*3/uL (ref 150–400)
RBC: 4.12 MIL/uL — ABNORMAL LOW (ref 4.22–5.81)
RDW: 12.9 % (ref 11.5–15.5)
WBC: 8.6 10*3/uL (ref 4.0–10.5)
nRBC: 0 % (ref 0.0–0.2)

## 2021-08-05 LAB — COMPREHENSIVE METABOLIC PANEL
ALT: 45 U/L — ABNORMAL HIGH (ref 0–44)
AST: 66 U/L — ABNORMAL HIGH (ref 15–41)
Albumin: 2.5 g/dL — ABNORMAL LOW (ref 3.5–5.0)
Alkaline Phosphatase: 75 U/L (ref 38–126)
Anion gap: 4 — ABNORMAL LOW (ref 5–15)
BUN: 7 mg/dL (ref 6–20)
CO2: 27 mmol/L (ref 22–32)
Calcium: 8.4 mg/dL — ABNORMAL LOW (ref 8.9–10.3)
Chloride: 106 mmol/L (ref 98–111)
Creatinine, Ser: 0.58 mg/dL — ABNORMAL LOW (ref 0.61–1.24)
GFR, Estimated: >60 mL/min (ref >60)
Glucose, Bld: 99 mg/dL (ref 70–99)
Potassium: 3.9 mmol/L (ref 3.5–5.1)
Sodium: 137 mmol/L (ref 135–145)
Total Bilirubin: <0.1 mg/dL — ABNORMAL LOW (ref 0.3–1.2)
Total Protein: 6 g/dL — ABNORMAL LOW (ref 6.5–8.1)

## 2021-08-05 LAB — HIV ANTIBODY (ROUTINE TESTING W REFLEX): HIV Screen 4th Generation wRfx: NONREACTIVE

## 2021-08-05 LAB — CK: Total CK: 59 U/L (ref 49–397)

## 2021-08-05 LAB — LACTIC ACID, PLASMA: Lactic Acid, Venous: 0.8 mmol/L (ref 0.5–1.9)

## 2021-08-05 MED ORDER — VANCOMYCIN HCL 1000 MG/200ML IV SOLN
1000.0000 mg | Freq: Three times a day (TID) | INTRAVENOUS | Status: DC
  Administered 2021-08-05 – 2021-08-06 (×5): 1000 mg via INTRAVENOUS
  Filled 2021-08-05 (×6): qty 200

## 2021-08-05 MED ORDER — HYDRALAZINE HCL 20 MG/ML IJ SOLN: 10.0000 mg | INTRAMUSCULAR | Status: DC | PRN

## 2021-08-05 NOTE — ED Notes (Signed)
Provider at bedside

## 2021-08-05 NOTE — Progress Notes (Signed)
Pharmacy Antibiotic Note  Caleb Bush is a 40 y.o. male admitted on 08/04/2021 with  wound infection .  Pharmacy has been consulted for Vancomycin/Aztreonam dosing. WBC is elevated. Renal function good.   Plan: Vancomycin 1000 mg IV q8h >>>Estimated AUC: 515 Aztreonam 1g IV q8h Flagyl per MD Trend WBC, temp, renal function  F/U infectious work-up Drug levels as indicated   Height: 5\' 11"  (180.3 cm) Weight: 72.6 kg (160 lb) IBW/kg (Calculated) : 75.3  Temp (24hrs), Avg:98.2 F (36.8 C), Min:98.1 F (36.7 C), Max:98.2 F (36.8 C)  Recent Labs  Lab 08/04/21 1718  WBC 12.2*  CREATININE 0.57*  LATICACIDVEN 1.3    Estimated Creatinine Clearance: 123.5 mL/min (A) (by C-G formula based on SCr of 0.57 mg/dL (L)).    Allergies  Allergen Reactions   Clonidine Derivatives Other (See Comments)    UNKNOWN   Ivp Dye [Iodinated Diagnostic Agents] Other (See Comments)    UNKNOWN   Penicillins Hives    13/05/22, PharmD, BCPS Clinical Pharmacist Phone: [phone]

## 2021-08-05 NOTE — ED Notes (Signed)
Pt provided with snacks and beverage. Pt ambulatory to the restroom.

## 2021-08-05 NOTE — Progress Notes (Signed)
PROGRESS NOTE  Caleb Bush [id number] DOB: March 02, 1981   PCP: System, Provider Not In  Patient is from: Home.  Cane, rolling walker and wheelchair at baseline.  DOA: 08/04/2021 LOS: 1  Chief complaints:  Chief Complaint  Patient presents with   Wound Infection     Brief Narrative / Interim history: 40 year old M with history of ongoing IVDU, HTN, tobacco use disorder and prior septic arthritis involving both hips presenting with left upper extremity swelling, subjective fevers and chills, and admitted for left upper extremity cellulitis.  Cultures obtained.  Started on vancomycin and aztreonam.  Orthopedic surgery consulted.   Subjective: Seen and examined earlier this morning.  No major events overnight of this morning.  He was sleepy but wakes to voice.  He reports 9/10 pain in left arm although he tends to fall asleep often during our conversation.  He admits to injecting IV heroin alternating his arms.  He also admits to using crystal meth.  He denies drinking alcohol.  Objective: Vitals:   08/05/21 0200 08/05/21 0205 08/05/21 0600 08/05/21 1000  BP: 120/67  122/86 118/73  Pulse: 83 75 65 71  Resp: 16 12 10 19   Temp:      SpO2: 99% 97% 98% 96%  Weight:      Height:        Intake/Output Summary (Last 24 hours) at 08/05/2021 1445 Last data filed at 08/05/2021 1422 Gross per 24 hour  Intake 2063.05 ml  Output 1000 ml  Net 1063.05 ml   Filed Weights   08/04/21 1656  Weight: 72.6 kg    Examination:  GENERAL: No apparent distress.  Nontoxic. HEENT: MMM.  Vision and hearing grossly intact.  NECK: Supple.  No apparent JVD.  RESP:  No IWOB.  Fair aeration bilaterally. CVS:  RRR. Heart sounds normal.  ABD/GI/GU: BS+. Abd soft, NTND.  MSK/EXT:  Moves extremities. No apparent deformity.  LUE swelling and erythema SKIN: Erythema skin over left upper extremity.  See pictures under media. NEURO: Awake, alert and oriented appropriately.  No apparent focal neuro  deficit. PSYCH: Calm. Normal affect.   Procedures:  None  Microbiology summarized: [id number] and influenza PCR nonreactive.  Assessment & Plan: Cellulitis of left upper extremity and patient with ongoing IVDU and prior history of septic arthritis.  CT findings suggestive for cellulitis.  Blood cultures NGTD so far. -Continue IV vancomycin and aztreonam for now -Appreciate help by orthopedic surgery  Polysubstance use including IVDU and crystal meth: Patient is not interested in Suboxone. -On Librium taper -TOC consulted for resources  Essential hypertension: Normotensive. -IV hydralazine as needed  Ambulatory dysfunction/physical deconditioning-uses cane, rolling walker and wheelchair at baseline -PT/OT eval.  Tobacco use disorder: Reports smoking about 5 cigarettes a day. -Encourage tobacco cessation -Nicotine patch recommended   Body mass index is 22.32 kg/m.         DVT prophylaxis:  SCDs Start: 08/04/21 2215  Code Status: Full code Family Communication: Patient and/or RN. Available if any question.  Level of care: Telemetry Medical Status is: Inpatient  Remains inpatient appropriate because: Need for IV antibiotics for cellulitis in patient with history of IVDU   Consultants:  Orthopedic surgery   Sch Meds:  Scheduled Meds:  acetaminophen  1,000 mg Oral Once   chlordiazePOXIDE  25 mg Oral QID   Followed by   Derrill Memo ON 08/06/2021] chlordiazePOXIDE  25 mg Oral TID   Followed by   Derrill Memo ON 08/07/2021] chlordiazePOXIDE  25 mg Oral BH-qamhs   Followed  by   Derrill Memo ON 08/08/2021] chlordiazePOXIDE  25 mg Oral Daily   multivitamin with minerals  1 tablet Oral Daily   thiamine  100 mg Oral Daily   Continuous Infusions:  aztreonam Stopped (08/05/21 1106)   lactated ringers 100 mL/hr at 08/05/21 1422   metronidazole Stopped (08/05/21 1326)   vancomycin Stopped (08/05/21 0652)   PRN Meds:.acetaminophen **OR** acetaminophen, chlordiazePOXIDE, hydrALAZINE,  hydrOXYzine, ketorolac, loperamide, ondansetron  Antimicrobials: Anti-infectives (From admission, onward)    Start     Dose/Rate Route Frequency Ordered Stop   08/05/21 0600  vancomycin (VANCOREADY) IVPB 1000 mg/200 mL        1,000 mg 200 mL/hr over 60 Minutes Intravenous Every 8 hours 08/05/21 0117     08/04/21 2230  vancomycin (VANCOREADY) IVPB 1000 mg/200 mL  Status:  Discontinued        1,000 mg 200 mL/hr over 60 Minutes Intravenous  Once 08/04/21 2217 08/04/21 2220   08/04/21 2230  metroNIDAZOLE (FLAGYL) IVPB 500 mg        500 mg 100 mL/hr over 60 Minutes Intravenous Every 12 hours 08/04/21 2218     08/04/21 2230  aztreonam (AZACTAM) 1 g in sodium chloride 0.9 % 100 mL IVPB        1 g 200 mL/hr over 30 Minutes Intravenous Every 8 hours 08/04/21 2219     08/04/21 1915  vancomycin (VANCOREADY) IVPB 1500 mg/300 mL        1,500 mg 150 mL/hr over 120 Minutes Intravenous  Once 08/04/21 1903 08/04/21 2229        I have personally reviewed the following labs and images: CBC: Recent Labs  Lab 08/04/21 1718 08/05/21 0832  WBC 12.2* 8.6  NEUTROABS 10.1*  --   HGB 13.2 11.4*  HCT 41.2 36.4*  MCV 88.2 88.3  PLT 275 237   BMP &GFR Recent Labs  Lab 08/04/21 1718 08/05/21 0832  NA 139 137  K 3.9 3.9  CL 104 106  CO2 28 27  GLUCOSE 100* 99  BUN 9 7  CREATININE 0.57* 0.58*  CALCIUM 9.0 8.4*   Estimated Creatinine Clearance: 123.5 mL/min (A) (by C-G formula based on SCr of 0.58 mg/dL (L)). Liver & Pancreas: Recent Labs  Lab 08/04/21 1718 08/05/21 0832  AST 39 66*  ALT 31 45*  ALKPHOS 91 75  BILITOT 0.5 <0.1*  PROT 7.4 6.0*  ALBUMIN 3.3* 2.5*   No results for input(s): LIPASE, AMYLASE in the last 168 hours. No results for input(s): AMMONIA in the last 168 hours. Diabetic: No results for input(s): HGBA1C in the last 72 hours. No results for input(s): GLUCAP in the last 168 hours. Cardiac Enzymes: Recent Labs  Lab 08/04/21 2214  CKTOTAL 59   No results for  input(s): PROBNP in the last 8760 hours. Coagulation Profile: No results for input(s): INR, PROTIME in the last 168 hours. Thyroid Function Tests: No results for input(s): TSH, T4TOTAL, FREET4, T3FREE, THYROIDAB in the last 72 hours. Lipid Profile: No results for input(s): CHOL, HDL, LDLCALC, TRIG, CHOLHDL, LDLDIRECT in the last 72 hours. Anemia Panel: No results for input(s): VITAMINB12, FOLATE, FERRITIN, TIBC, IRON, RETICCTPCT in the last 72 hours. Urine analysis:    Component Value Date/Time   COLORURINE COLORLESS (A) 12/15/2008 2016   APPEARANCEUR CLEAR 12/15/2008 2016   LABSPEC 1.005 12/15/2008 2016   PHURINE 6.5 12/15/2008 2016   GLUCOSEU NEGATIVE 12/15/2008 2016   HGBUR NEGATIVE 12/15/2008 2016   BILIRUBINUR NEGATIVE 12/15/2008 2016   KETONESUR NEGATIVE 12/15/2008  2016   PROTEINUR NEGATIVE 12/15/2008 2016   UROBILINOGEN 0.2 12/15/2008 2016   NITRITE NEGATIVE 12/15/2008 2016   LEUKOCYTESUR  12/15/2008 2016    NEGATIVE MICROSCOPIC NOT DONE ON URINES WITH NEGATIVE PROTEIN, BLOOD, LEUKOCYTES, NITRITE, OR GLUCOSE <1000 mg/dL.   Sepsis Labs: Invalid input(s): PROCALCITONIN, LACTICIDVEN  Microbiology: Recent Results (from the past 240 hour(s))  Blood culture (routine x 2)     Status: None (Preliminary result)   Collection Time: 08/04/21  5:30 PM   Specimen: BLOOD RIGHT HAND  Result Value Ref Range Status   Specimen Description BLOOD RIGHT HAND  Final   Special Requests   Final    BOTTLES DRAWN AEROBIC AND ANAEROBIC Blood Culture results may not be optimal due to an inadequate volume of blood received in culture bottles   Culture   Final    NO GROWTH < 24 HOURS Performed at [facility], [address] [place], [place] [address]    Report Status PENDING  Incomplete  Blood culture (routine x 2)     Status: None (Preliminary result)   Collection Time: 08/04/21  6:07 PM   Specimen: BLOOD LEFT FOREARM  Result Value Ref Range Status   Specimen Description BLOOD LEFT  FOREARM  Final   Special Requests   Final    BOTTLES DRAWN AEROBIC AND ANAEROBIC Blood Culture adequate volume   Culture   Final    NO GROWTH < 24 HOURS Performed at [facility], [address], [place], [place] [address]    Report Status PENDING  Incomplete  Resp Panel by RT-PCR (Flu A&B, Covid) Nasopharyngeal Swab     Status: None   Collection Time: 08/04/21  8:31 PM   Specimen: Nasopharyngeal Swab; Nasopharyngeal(NP) swabs in vial transport medium  Result Value Ref Range Status   SARS Coronavirus 2 by RT PCR NEGATIVE NEGATIVE Final    Comment: (NOTE) SARS-CoV-2 target nucleic acids are NOT DETECTED.  The SARS-CoV-2 RNA is generally detectable in upper respiratory specimens during the acute phase of infection. The lowest concentration of SARS-CoV-2 viral copies this assay can detect is 138 copies/mL. A negative result does not preclude SARS-Cov-2 infection and should not be used as the sole basis for treatment or other patient management decisions. A negative result may occur with  improper specimen collection/handling, submission of specimen other than nasopharyngeal swab, presence of viral mutation(s) within the areas targeted by this assay, and inadequate number of viral copies(<138 copies/mL). A negative result must be combined with clinical observations, patient history, and epidemiological information. The expected result is Negative.  Fact Sheet for Patients:  BloggerCourse.com  Fact Sheet for Healthcare Providers:  SeriousBroker.it  This test is no t yet approved or cleared by the [place] FDA and  has been authorized for detection and/or diagnosis of SARS-CoV-2 by FDA under an Emergency Use Authorization (EUA). This EUA will remain  in effect (meaning this test can be used) for the duration of the COVID-19 declaration under Section 564(b)(1) of the Act, 21 U.S.C.section 360bbb-3(b)(1), unless the  authorization is terminated  or revoked sooner.       Influenza A by PCR NEGATIVE NEGATIVE Final   Influenza B by PCR NEGATIVE NEGATIVE Final    Comment: (NOTE) The Xpert Xpress SARS-CoV-2/FLU/RSV plus assay is intended as an aid in the diagnosis of influenza from Nasopharyngeal swab specimens and should not be used as a sole basis for treatment. Nasal washings and aspirates are unacceptable for Xpert Xpress SARS-CoV-2/FLU/RSV testing.  Fact Sheet for Patients: EntrepreneurPulse.com.au  Fact Sheet for Healthcare Providers: IncredibleEmployment.be  This test is not yet approved or cleared by the [place] FDA and has been authorized for detection and/or diagnosis of SARS-CoV-2 by FDA under an Emergency Use Authorization (EUA). This EUA will remain in effect (meaning this test can be used) for the duration of the COVID-19 declaration under Section 564(b)(1) of the Act, 21 U.S.C. section 360bbb-3(b)(1), unless the authorization is terminated or revoked.  Performed at [facility], [place] [address], [place], [place] [address]     Radiology Studies: DG Chest 2 View  Result Date: 08/04/2021 CLINICAL DATA:  Arm infection. EXAM: CHEST - 2 VIEW COMPARISON:  April 15, 2018 FINDINGS: The heart size and mediastinal contours are within normal limits. Both lungs are clear. The visualized skeletal structures are unremarkable. IMPRESSION: No active cardiopulmonary disease. Electronically Signed   By: Fidela Salisbury M.D.   On: 08/04/2021 19:07   DG Elbow Complete Left  Result Date: 08/04/2021 CLINICAL DATA:  Arm infection. EXAM: LEFT ELBOW - COMPLETE 3+ VIEW COMPARISON:  None. FINDINGS: There is no evidence of fracture, dislocation, or joint effusion. There is no evidence of arthropathy or other focal bone abnormality. Diffuse soft tissue swelling. IMPRESSION: 1. Diffuse soft tissue swelling. 2. No radiographic evidence of osteomyelitis.  Electronically Signed   By: Fidela Salisbury M.D.   On: 08/04/2021 19:07   CT FOREARM LEFT WO CONTRAST  Result Date: 08/04/2021 CLINICAL DATA:  Diffuse edema, suspected infectious etiology, from the distal left upper arm through the forearm and wrist. EXAM: CT OF THE LEFT FOREARM WITHOUT CONTRAST TECHNIQUE: Multidetector CT imaging was performed according to the standard protocol. Multiplanar CT image reconstructions were also generated. COMPARISON:  Left elbow series earlier today's the only relevant prior FINDINGS: Bones/Joint/Cartilage There is normal bone mineralization without evidence of fracture, dislocation or destructive bone lesion. All joint spaces are maintained at the elbow and wrist. Ligaments Suboptimally assessed by CT. No bony distraction is seen which would suggest an obvious ligamentous injury. Muscles and Tendons Also not well evaluated with non contrasted CT. There are no obvious secondary signs of acute ligamentous injury. Edema suspected in the muscle groups of the visualized distal upper arm and forearm marked by loss of distinction of fat planes between the muscles. Soft tissues There is diffuse edema in the distal upper arm, at the level the elbow and throughout the forearm and wrist, as above also probably involves the muscle groups with loss of definition of intermuscular fat planes. There is scattered nonlocalizing fluid seen along the fat muscle interfaces in the distal upper arm and forearm, but there is no localized fluid collection, abscess or soft tissue gas. Nevertheless, a necrotizing fasciitis must be considered in the differential diagnosis. No radiopaque foreign body is seen. IMPRESSION: 1. Diffuse edema extending from the distal upper arm through the elbow and forearm to the wrist. This also appears to involve the muscle groups. There is no soft tissue gas, but early necrotizing fasciitis could have this appearance as well as diffuse cellulitis/myositis. 2. No  destructive bone lesion or erosive arthropathy is seen. There is no radiopaque foreign body. 3. Nonlocalizing fluid along the fat muscle interfaces is present but no focal fluid collection is visible. Electronically Signed   By: Telford Nab M.D.   On: 08/04/2021 20:37      Dolan Xia T. Berthold  If 7PM-7AM, please contact night-coverage [email or web address] 08/05/2021, 2:45 PM

## 2021-08-05 NOTE — Plan of Care (Signed)
Pt admitted for cellulitis of  L upper extremity. Pt given prn toradol for pain. No obvious s/s of infection. BP (!) 142/96 (BP Location: Right Arm)   Pulse 88   Temp (!) 97.5 F (36.4 C) (Oral)   Resp 18   Ht 5\' 11"  (1.803 m)   Wt 75.3 kg   SpO2 100%   BMI 23.15 kg/m  Placed on tele verified with Dantonio NSR. IV antibiotics started , skin assessment with D'antono no open wounds, mulitple scars on left leg and foot from previous surgeries . Pt oriented to unit with no further questions at this time. 08/05/21 10:47 PM

## 2021-08-05 NOTE — Consult Note (Signed)
ORTHOPAEDIC CONSULTATION  REQUESTING PHYSICIAN: Almon Hercules, MD  Chief Complaint: left arm pain and swelling  HPI: Caleb Bush is a 40 y.o. male who complains of  left arm pain and swelling. This started 3 days ago and has gotten worse. No known injury. He started to notice swelling and white head at his left elbow and poked at it but had minimal drainage. Today pain is mild/moderate at rest, worse with movement at elbow. Pain radiates down into hand. Denies paraesthesias. Patient has history of IV drug use and states he has had previous cellulitis of his left arm but never to this extent. 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  Most recent IV drug use was 2 days ago. He states he has not been injecting in his left arm. Denies chest pain, shortness of breath, or abdominal pain.   Past Medical History:  Diagnosis Date   GERD (gastroesophageal reflux disease)    Hepatitis C    History of substance abuse (HCC)    Mandible fracture (HCC) 07/18/2017   Teeth missing    Past Surgical History:  Procedure Laterality Date   CLOSED REDUCTION MANDIBLE Right 07/18/2017   Procedure: CLOSED REDUCTION CONDYLAR FRACTURE;  Surgeon: Vivia Ewing, DMD;  Location: [facility];  Service: Oral Surgery;  Laterality: Right;   HIP SURGERY     MANDIBULAR HARDWARE REMOVAL N/A 09/15/2017   Procedure: MANDIBULAR HARDWARE REMOVAL;  Surgeon: Vivia Ewing, DMD;  Location: [facility];  Service: Oral Surgery;  Laterality: N/A;   MASS EXCISION Left 09/15/2017   Procedure: BIOPSY OF TISSUE LEFT MANDIBLE;  Surgeon: Vivia Ewing, DMD;  Location: [facility];  Service: Oral Surgery;  Laterality: Left;   MULTIPLE EXTRACTIONS WITH ALVEOLOPLASTY Bilateral 07/18/2017   Procedure: MULTIPLE EXTRACTION OF # 1, 14, 19, 30 & 31;  Surgeon: Vivia Ewing, DMD;  Location: [facility];  Service: Oral Surgery;  Laterality: Bilateral;   ORIF MANDIBULAR FRACTURE Left 07/18/2017   Procedure: OPEN REDUCTION INTERNAL  FIXATION (ORIF) MANDIBULAR FRACTURE;  Surgeon: Vivia Ewing, DMD;  Location: [facility];  Service: Oral Surgery;  Laterality: Left;   Social History   Socioeconomic History   Marital status: Single    Spouse name: Not on file   Number of children: Not on file   Years of education: Not on file   Highest education level: Not on file  Occupational History   Not on file  Tobacco Use   Smoking status: Every Day    Packs/day: 0.00    Types: Cigarettes   Smokeless tobacco: Never  Vaping Use   Vaping Use: Never used  Substance and Sexual Activity   Alcohol use: No   Drug use: Not Currently    Types: IV   Sexual activity: Not on file  Other Topics Concern   Not on file  Social History Narrative   Not on file   Social Determinants of Health   Financial Resource Strain: Not on file  Food Insecurity: Not on file  Transportation Needs: Not on file  Physical Activity: Not on file  Stress: Not on file  Social Connections: Not on file   History reviewed. No pertinent family history. Allergies  Allergen Reactions   Clonidine Derivatives Other (See Comments)    UNKNOWN   Ivp Dye [Iodinated Diagnostic Agents] Other (See Comments)    UNKNOWN   Penicillins Hives     Positive ROS: All other systems have been reviewed and were otherwise negative with the exception  of those mentioned in the HPI and as above.  Physical Exam: General: Alert, no acute distress.  Cardiovascular: No pedal edema Respiratory: No cyanosis, no use of accessory musculature GI: No organomegaly, abdomen is soft and non-tender Skin: See pictures below. Neurologic: Sensation intact distally Psychiatric: Patient is competent for consent with normal mood and affect Lymphatic: No axillary or cervical lymphadenopathy  MUSCULOSKELETAL: LUE 10-90 deg AROM at left elbow with moderate pain. Significant edema extending from mid humerus to all fingers of hand and erythema extending from mid humerus to wrist. Not able to make  a complete fist. Patient endorses sensation to all fingers of left hand, not able to make a complete fist. Hand warm and well perfused. Purulent drainage on bandages, this was removed and new bandages placed.      Imaging:  CT of left arm: 1. Diffuse edema extending from the distal upper arm through the elbow and forearm to the wrist. This also appears to involve the muscle groups. There is no soft tissue gas, but early necrotizing fasciitis could have this appearance as well as diffuse cellulitis/myositis. 2. No destructive bone lesion or erosive arthropathy is seen. There is no radiopaque foreign body. 3. Nonlocalizing fluid along the fat muscle interfaces is present but no focal fluid collection is visible.    Assessment: Cellulitis of left arm in patient with history of IV drug use  Plan: - IV antibiotics and observation - PRN dressing changes, if drainage noted on dressings ok to replace with clean dry gauze and curlex - will order sling elevator   Ventura Bruns, PA-C    08/05/2021 8:56 AM

## 2021-08-05 NOTE — ED Notes (Signed)
Provider at bedside dressing wound

## 2021-08-06 DIAGNOSIS — Z9189 Other specified personal risk factors, not elsewhere classified: Secondary | ICD-10-CM

## 2021-08-06 LAB — CBC
HCT: 38.9 % — ABNORMAL LOW (ref 39.0–52.0)
Hemoglobin: 12.6 g/dL — ABNORMAL LOW (ref 13.0–17.0)
MCH: 28.7 pg (ref 26.0–34.0)
MCHC: 32.4 g/dL (ref 30.0–36.0)
MCV: 88.6 fL (ref 80.0–100.0)
Platelets: 254 10*3/uL (ref 150–400)
RBC: 4.39 MIL/uL (ref 4.22–5.81)
RDW: 12.7 % (ref 11.5–15.5)
WBC: 9.5 10*3/uL (ref 4.0–10.5)
nRBC: 0 % (ref 0.0–0.2)

## 2021-08-06 LAB — HEPATIC FUNCTION PANEL
ALT: 56 U/L — ABNORMAL HIGH (ref 0–44)
AST: 66 U/L — ABNORMAL HIGH (ref 15–41)
Albumin: 2.6 g/dL — ABNORMAL LOW (ref 3.5–5.0)
Alkaline Phosphatase: 81 U/L (ref 38–126)
Bilirubin, Direct: 0.1 mg/dL (ref 0.0–0.2)
Indirect Bilirubin: 0.3 mg/dL (ref 0.3–0.9)
Total Bilirubin: 0.4 mg/dL (ref 0.3–1.2)
Total Protein: 6.1 g/dL — ABNORMAL LOW (ref 6.5–8.1)

## 2021-08-06 LAB — RENAL FUNCTION PANEL
Albumin: 2.5 g/dL — ABNORMAL LOW (ref 3.5–5.0)
Anion gap: 7 (ref 5–15)
BUN: 13 mg/dL (ref 6–20)
CO2: 26 mmol/L (ref 22–32)
Calcium: 8.5 mg/dL — ABNORMAL LOW (ref 8.9–10.3)
Chloride: 105 mmol/L (ref 98–111)
Creatinine, Ser: 0.72 mg/dL (ref 0.61–1.24)
GFR, Estimated: >60 mL/min (ref >60)
Glucose, Bld: 114 mg/dL — ABNORMAL HIGH (ref 70–99)
Phosphorus: 3.9 mg/dL (ref 2.5–4.6)
Potassium: 3.5 mmol/L (ref 3.5–5.1)
Sodium: 138 mmol/L (ref 135–145)

## 2021-08-06 LAB — MAGNESIUM: Magnesium: 1.8 mg/dL (ref 1.7–2.4)

## 2021-08-06 MED ORDER — POTASSIUM CHLORIDE CRYS ER 20 MEQ PO TBCR
40.0000 meq | EXTENDED_RELEASE_TABLET | Freq: Once | ORAL | Status: AC
  Administered 2021-08-06: 40 meq via ORAL
  Filled 2021-08-06: qty 2

## 2021-08-06 MED ORDER — SODIUM CHLORIDE 0.9 % IV SOLN: 2.0000 g | Freq: Three times a day (TID) | INTRAVENOUS | Status: DC

## 2021-08-06 MED ORDER — METHADONE HCL 10 MG PO TABS
30.0000 mg | ORAL_TABLET | Freq: Every day | ORAL | Status: DC
  Administered 2021-08-06: 30 mg via ORAL
  Filled 2021-08-06: qty 3

## 2021-08-06 NOTE — Evaluation (Signed)
Occupational Therapy Evaluation Patient Details Name: Caleb Bush MRN: [id number] DOB: 01/26/1981 Today's Date: 08/06/2021   History of Present Illness Pt is 40 y/o M admitted for cellulitis of  L upper extremity. PMH includes Hep c, hx of substance abuse, mandible fx, and septic arthritis.   Clinical Impression   Pt presents with decreased balance and pain/edema secondary to impairments above. Pt currently requiring supervision for ADLs and functional transfers/mobility. Pt completed LUE exercises, provided with handout and was educated on sling. Tolerated exercises well, stating that pain is improving. Pt reports that he is currently homeless, but that his girlfriend stays with him at all times and will be available to provide assistance. Pt not anticipated to require further skilled OT services at d/c once medically cleared. Will follow acutely to maximize safety/independence with ADLs and restore ROM/strength/function to LUE while admitted.      Recommendations for follow up therapy are one component of a multi-disciplinary discharge planning process, led by the attending physician.  Recommendations may be updated based on patient status, additional functional criteria and insurance authorization.   Follow Up Recommendations  No OT follow up    Assistance Recommended at Discharge PRN  Functional Status Assessment  Patient has had a recent decline in their functional status and demonstrates the ability to make significant improvements in function in a reasonable and predictable amount of time.  Equipment Recommendations  None recommended by OT    Recommendations for Other Services       Precautions / Restrictions Precautions Precautions: Fall Required Braces or Orthoses: Sling (LUE) Restrictions Weight Bearing Restrictions: No      Mobility Bed Mobility               General bed mobility comments: In bathroom on arrival    Transfers Overall transfer level:  Needs assistance Equipment used: Straight cane Transfers: Sit to/from Stand Sit to Stand: Supervision                  Balance Overall balance assessment: Needs assistance Sitting-balance support: No upper extremity supported;Feet supported Sitting balance-Leahy Scale: Good     Standing balance support: Single extremity supported;No upper extremity supported;During functional activity Standing balance-Leahy Scale: Fair Standing balance comment: Noted unsteadiness when standing and ambulating. Encouraged to use cane, however pt states he typically only uses it when he feels tired. Reported he does not have history of falling.                           ADL either performed or assessed with clinical judgement   ADL Overall ADL's : Needs assistance/impaired Eating/Feeding: Independent;Sitting   Grooming: Supervision/safety;Standing   Upper Body Bathing: Supervision/ safety   Lower Body Bathing: Supervison/ safety   Upper Body Dressing : Supervision/safety   Lower Body Dressing: Supervision/safety   Toilet Transfer: Supervision/safety;Ambulation;Regular Toilet   Toileting- Architect and Hygiene: Independent       Functional mobility during ADLs: Supervision/safety;Cane General ADL Comments: Supervision level for ADLs and functional mobility due to decreased balance. LB dressed on arrival, stated he had dressed himself.     Vision         Perception     Praxis      Pertinent Vitals/Pain Pain Assessment: 0-10 Pain Score: 6  Pain Location: L elbow Pain Descriptors / Indicators: Burning;Sore Pain Intervention(s): Monitored during session;Repositioned     Hand Dominance Right   Extremity/Trunk Assessment Upper Extremity Assessment Upper Extremity Assessment:  LUE deficits/detail LUE Deficits / Details: Noted edema 2/2 cellulitis. Reporting better ROM and less pain than yesterday. AROM 10 - 100* at elbow with pain at end ranges. Able to  close fist today. Reporting slight numbness in fingertips, however does not appear to be impairing function at this time. LUE Coordination: decreased fine motor;decreased gross motor   Lower Extremity Assessment Lower Extremity Assessment: Defer to PT evaluation       Communication Communication Communication: No difficulties   Cognition Arousal/Alertness: Awake/alert Behavior During Therapy: WFL for tasks assessed/performed Overall Cognitive Status: Within Functional Limits for tasks assessed                                       General Comments       Exercises Exercises: General Upper Extremity;Other exercises General Exercises - Upper Extremity Shoulder Flexion: AROM;Left;10 reps;Seated Shoulder Extension: AROM;Left;10 reps;Seated Elbow Flexion: AROM;10 reps;Seated;Left Elbow Extension: AROM;Left;10 reps;Seated Wrist Flexion: AROM;Left;10 reps;Seated Wrist Extension: AROM;10 reps;Left;Seated Digit Composite Flexion: AROM;Left;10 reps;Seated Composite Extension: AROM;Left;10 reps;Seated Other Exercises Other Exercises: Wrist ulnar/radial deviation, left, x10, seated Other Exercises: Wrist pronation/supination, left, x10, seated   Shoulder Instructions      Home Living Family/patient expects to be discharged to:: Shelter/Homeless Living Arrangements: Spouse/significant other Available Help at Discharge: Other (Comment);Available 24 hours/day (significant other)                         Home Equipment: Cane - single point          Prior Functioning/Environment Prior Level of Function : Independent/Modified Independent             Mobility Comments: Intermittent use of cane for mobility          OT Problem List: Decreased range of motion;Decreased activity tolerance;Impaired balance (sitting and/or standing);Decreased coordination;Decreased safety awareness;Decreased knowledge of precautions;Impaired UE functional  use;Pain;Increased edema      OT Treatment/Interventions: Self-care/ADL training;Therapeutic exercise;Neuromuscular education;Manual therapy;Therapeutic activities;Patient/family education;Balance training    OT Goals(Current goals can be found in the care plan section) Acute Rehab OT Goals Patient Stated Goal: improve LUE function OT Goal Formulation: With patient Time For Goal Achievement: 08/20/21 Potential to Achieve Goals: Good  OT Frequency: Min 3X/week   Barriers to D/C:            Co-evaluation              AM-PAC OT "6 Clicks" Daily Activity     Outcome Measure Help from another person eating meals?: None Help from another person taking care of personal grooming?: A Little Help from another person toileting, which includes using toliet, bedpan, or urinal?: A Little Help from another person bathing (including washing, rinsing, drying)?: A Little Help from another person to put on and taking off regular upper body clothing?: A Little Help from another person to put on and taking off regular lower body clothing?: A Little 6 Click Score: 19   End of Session Nurse Communication: Mobility status  Activity Tolerance: Patient limited by pain Patient left: in chair;with call bell/phone within reach;with family/visitor present  OT Visit Diagnosis: Unsteadiness on feet (R26.81);Other abnormalities of gait and mobility (R26.89);Pain;Muscle weakness (generalized) (M62.81) Pain - Right/Left: Left Pain - part of body: Arm                Time: 5449-2010 OT Time Calculation (min): 26 min Charges:  OT General Charges $OT Visit: 1 Visit OT Evaluation $OT Eval Low Complexity: 1 Low OT Treatments $Therapeutic Exercise: 8-22 mins  Nikita Humble C, OT/L  Acute Rehab [phone]  Lenice Llamas 08/06/2021, 10:10 AM

## 2021-08-06 NOTE — Discharge Summary (Signed)
Physician Discharge Summary  Caleb Bush YQM:[id number] DOB: [birth year] DOA: 08/04/2021  PCP: System, Provider Not In  Admit date: 08/04/2021 Discharge date: 08/06/2021 Admitted From: Home Disposition: Patient eloped.  Home Health: Not indicated Equipment/Devices: Not indicated Discharge Condition: Stable but at risk for severe complication from infection CODE STATUS: Full code  Hospital Course: 40 year old M with history of ongoing IVDU, HTN, tobacco use disorder and prior septic arthritis involving both hips presenting with left upper extremity swelling, subjective fevers and chills, and admitted for left upper extremity cellulitis.  Cultures obtained.  Started on vancomycin and aztreonam.  Orthopedic surgery consulted, and recommended monitoring on IV antibiotics.  Blood cultures NGTD.  Patient was on broad-spectrum antibiotics when he eloped.  Per patient's RN, "received a phone call from telemetry at 1300, Ladona Ridgel reported that patient's HR was in 150s and patient had came off the tele.  Walked into patient room, patient was nowhere to be found, IV was found on the floor".   Of note, patient threatened to leave AMA unless he gets methadone for his opiate craving.  He states Librium taper did not help with his craving and withdrawal symptoms.  He refused Suboxone.  I have discussed the risk of infection progression that could potentially lead to serious complication and death without adequate treatment.  He voiced understanding and agreed to stay after we have agreed on starting methadone.  He received his first dose about 11 AM.   Attempted to call patient's using the phone number listed.  A woman claiming to be his advocate answered the call on [phone].  She was not aware of his hospitalization nor his whereabout.  Also attempted to call [phone] which was the number listed as home phone but there was no answer.  Did not leave voicemail on generic voicemail prompt.   Patient's grandmother, Georgeann Oppenheim did not answer when I called out [phone].  She has no voicemail.  See individual problem list below for more on hospital course.  Discharge Diagnoses:  Cellulitis of left upper extremity and patient with ongoing IVDU and prior history of septic arthritis.  CT findings suggestive for cellulitis.  Blood cultures NGTD so far. 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  Suboxone and Librium not working per patient's report.  No QTC on EKG. -Started methadone at 30 mg daily -Monitor QT and electrolytes. -TOC consulted for resources   Essential hypertension: Normotensive. -IV hydralazine as needed   Ambulatory dysfunction/physical deconditioning-uses cane, rolling walker and wheelchair at baseline -PT/OT eval.   Tobacco use disorder: Reports smoking about 5 cigarettes a day. -Encourage tobacco cessation -Nicotine patch recommended  Elopement-patient has capacity to make medical decision but making poor decision by eloping  Body mass index is 23.15 kg/m.           Discharge Exam: Vitals:   08/05/21 1800 08/05/21 2021 08/05/21 2022 08/06/21 0815  BP: (!) 160/83  (!) 142/96 119/87  Pulse: 91  88 65  Temp:   (!) 97.5 F (36.4 C) 97.8 F (36.6 C)  Resp: 15  18 16   Height:      Weight:  75.3 kg    SpO2: 98%  100% 98%  TempSrc:   Oral   BMI (Calculated):  23.16       GENERAL: No apparent distress.  Nontoxic. HEENT: MMM.  Vision and hearing grossly intact.  NECK: Supple.  No apparent JVD.  RESP: 98% on RA.  No IWOB.  Fair aeration bilaterally. CVS:  RRR. Heart sounds normal.  ABD/GI/GU: Bowel sounds present. Soft. Non tender.  MSK/EXT:  Moves extremities.  Swelling and erythema in left upper extremity distal and proximal to elbow SKIN: Erythema, swelling and increased warmth to touch in left upper extremity. NEURO: Awake and  alert.  Oriented appropriately.  No apparent focal neuro deficit. PSYCH: Calm. Normal affect.  Discharge Instructions   Allergies as of 08/06/2021       Reactions   Clonidine Derivatives Other (See Comments)   UNKNOWN   Ivp Dye [iodinated Diagnostic Agents] Other (See Comments)   UNKNOWN     Med Rec must be completed prior to using this [facility]       Consultations: Orthopedic surgery  Procedures/Studies: None   DG Chest 2 View  Result Date: 08/04/2021 CLINICAL DATA:  Arm infection. EXAM: CHEST - 2 VIEW COMPARISON:  April 15, 2018 FINDINGS: The heart size and mediastinal contours are within normal limits. Both lungs are clear. The visualized skeletal structures are unremarkable. IMPRESSION: No active cardiopulmonary disease. Electronically Signed   By: Fidela Salisbury M.D.   On: 08/04/2021 19:07   DG Elbow Complete Left  Result Date: 08/04/2021 CLINICAL DATA:  Arm infection. EXAM: LEFT ELBOW - COMPLETE 3+ VIEW COMPARISON:  None. FINDINGS: There is no evidence of fracture, dislocation, or joint effusion. There is no evidence of arthropathy or other focal bone abnormality. Diffuse soft tissue swelling. IMPRESSION: 1. Diffuse soft tissue swelling. 2. No radiographic evidence of osteomyelitis. Electronically Signed   By: Fidela Salisbury M.D.   On: 08/04/2021 19:07   CT FOREARM LEFT WO CONTRAST  Result Date: 08/04/2021 CLINICAL DATA:  Diffuse edema, suspected infectious etiology, from the distal left upper arm through the forearm and wrist. EXAM: CT OF THE LEFT FOREARM WITHOUT CONTRAST TECHNIQUE: Multidetector CT imaging was performed according to the standard protocol. Multiplanar CT image reconstructions were also generated. COMPARISON:  Left elbow series earlier today's the only relevant prior FINDINGS: Bones/Joint/Cartilage There is normal bone mineralization without evidence of fracture, dislocation or destructive bone lesion. All joint spaces are maintained at the elbow  and wrist. Ligaments Suboptimally assessed by CT. No bony distraction is seen which would suggest an obvious ligamentous injury. Muscles and Tendons Also not well evaluated with non contrasted CT. There are no obvious secondary signs of acute ligamentous injury. Edema suspected in the muscle groups of the visualized distal upper arm and forearm marked by loss of distinction of fat planes between the muscles. Soft tissues There is diffuse edema in the distal upper arm, at the level the elbow and throughout the forearm and wrist, as above also probably involves the muscle groups with loss of definition of intermuscular fat planes. There is scattered nonlocalizing fluid seen along the fat muscle interfaces in the distal upper arm and forearm, but there is no localized fluid collection, abscess or soft tissue gas. Nevertheless, a necrotizing fasciitis must be considered in the differential diagnosis. No radiopaque foreign body is seen. IMPRESSION: 1. Diffuse edema extending from the distal upper arm through the elbow and forearm to the wrist. This also appears to involve the muscle groups. There is no soft tissue gas, but early necrotizing fasciitis could have this appearance as well as diffuse cellulitis/myositis. 2. No destructive bone lesion or erosive arthropathy is seen. There is no radiopaque foreign body. 3. Nonlocalizing fluid along the fat muscle interfaces is present but no focal fluid collection is visible.  Electronically Signed   By: Telford Nab M.D.   On: 08/04/2021 20:37       The results of significant diagnostics from this hospitalization (including imaging, microbiology, ancillary and laboratory) are listed below for reference.     Microbiology: Recent Results (from the past 240 hour(s))  Blood culture (routine x 2)     Status: None (Preliminary result)   Collection Time: 08/04/21  5:30 PM   Specimen: BLOOD RIGHT HAND  Result Value Ref Range Status   Specimen Description BLOOD RIGHT  HAND  Final   Special Requests   Final    BOTTLES DRAWN AEROBIC AND ANAEROBIC Blood Culture results may not be optimal due to an inadequate volume of blood received in culture bottles   Culture   Final    NO GROWTH 2 DAYS Performed at [facility], [place] [address] [place], [place] [address]    Report Status PENDING  Incomplete  Blood culture (routine x 2)     Status: None (Preliminary result)   Collection Time: 08/04/21  6:07 PM   Specimen: BLOOD LEFT FOREARM  Result Value Ref Range Status   Specimen Description BLOOD LEFT FOREARM  Final   Special Requests   Final    BOTTLES DRAWN AEROBIC AND ANAEROBIC Blood Culture adequate volume   Culture   Final    NO GROWTH 2 DAYS Performed at [facility], [place] [address] [place], [place] [address]    Report Status PENDING  Incomplete  Resp Panel by RT-PCR (Flu A&B, Covid) Nasopharyngeal Swab     Status: None   Collection Time: 08/04/21  8:31 PM   Specimen: Nasopharyngeal Swab; Nasopharyngeal(NP) swabs in vial transport medium  Result Value Ref Range Status   SARS Coronavirus 2 by RT PCR NEGATIVE NEGATIVE Final    Comment: (NOTE) SARS-CoV-2 target nucleic acids are NOT DETECTED.  The SARS-CoV-2 RNA is generally detectable in upper respiratory specimens during the acute phase of infection. The lowest concentration of SARS-CoV-2 viral copies this assay can detect is 138 copies/mL. A negative result does not preclude SARS-Cov-2 infection and should not be used as the sole basis for treatment or other patient management decisions. A negative result may occur with  improper specimen collection/handling, submission of specimen other than nasopharyngeal swab, presence of viral mutation(s) within the areas targeted by this assay, and inadequate number of viral copies(<138 copies/mL). A negative result must be combined with clinical observations, patient history, and epidemiological information. The expected result is Negative.  Fact  Sheet for Patients:  EntrepreneurPulse.com.au  Fact Sheet for Healthcare Providers:  IncredibleEmployment.be  This test is no t yet approved or cleared by the [place] FDA and  has been authorized for detection and/or diagnosis of SARS-CoV-2 by FDA under an Emergency Use Authorization (EUA). This EUA will remain  in effect (meaning this test can be used) for the duration of the COVID-19 declaration under Section 564(b)(1) of the Act, 21 U.S.C.section 360bbb-3(b)(1), unless the authorization is terminated  or revoked sooner.       Influenza A by PCR NEGATIVE NEGATIVE Final   Influenza B by PCR NEGATIVE NEGATIVE Final    Comment: (NOTE) The Xpert Xpress SARS-CoV-2/FLU/RSV plus assay is intended as an aid in the diagnosis of influenza from Nasopharyngeal swab specimens and should not be used as a sole basis for treatment. Nasal washings and aspirates are unacceptable for Xpert Xpress SARS-CoV-2/FLU/RSV testing.  Fact Sheet for Patients: EntrepreneurPulse.com.au  Fact Sheet for Healthcare Providers:  IncredibleEmployment.be  This test is not yet approved or cleared by the [place] and has been authorized for detection and/or diagnosis of SARS-CoV-2 by FDA under an Emergency Use Authorization (EUA). This EUA will remain in effect (meaning this test can be used) for the duration of the COVID-19 declaration under Section 564(b)(1) of the Act, 21 U.S.C. section 360bbb-3(b)(1), unless the authorization is terminated or revoked.  Performed at [facility], [place] [address] [place], [place] [address]      Labs:  CBC: Recent Labs  Lab 08/04/21 1718 08/05/21 0832 08/06/21 0125  WBC 12.2* 8.6 9.5  NEUTROABS 10.1*  --   --   HGB 13.2 11.4* 12.6*  HCT 41.2 36.4* 38.9*  MCV 88.2 88.3 88.6  PLT 275 237 254   BMP &GFR Recent Labs  Lab 08/04/21 1718 08/05/21 0832 08/06/21 0125  NA 139  137 138  K 3.9 3.9 3.5  CL 104 106 105  CO2 28 27 26   GLUCOSE 100* 99 114*  BUN 9 7 13   CREATININE 0.57* 0.58* 0.72  CALCIUM 9.0 8.4* 8.5*  MG  --   --  1.8  PHOS  --   --  3.9   Estimated Creatinine Clearance: 128.1 mL/min (by C-G formula based on SCr of 0.72 mg/dL). Liver & Pancreas: Recent Labs  Lab 08/04/21 1718 08/05/21 0832 08/06/21 0125  AST 39 66* 66*  ALT 31 45* 56*  ALKPHOS 91 75 81  BILITOT 0.5 <0.1* 0.4  PROT 7.4 6.0* 6.1*  ALBUMIN 3.3* 2.5* 2.6*  2.5*   No results for input(s): LIPASE, AMYLASE in the last 168 hours. No results for input(s): AMMONIA in the last 168 hours. Diabetic: No results for input(s): HGBA1C in the last 72 hours. No results for input(s): GLUCAP in the last 168 hours. Cardiac Enzymes: Recent Labs  Lab 08/04/21 2214  CKTOTAL 59   No results for input(s): PROBNP in the last 8760 hours. Coagulation Profile: No results for input(s): INR, PROTIME in the last 168 hours. Thyroid Function Tests: No results for input(s): TSH, T4TOTAL, FREET4, T3FREE, THYROIDAB in the last 72 hours. Lipid Profile: No results for input(s): CHOL, HDL, LDLCALC, TRIG, CHOLHDL, LDLDIRECT in the last 72 hours. Anemia Panel: No results for input(s): VITAMINB12, FOLATE, FERRITIN, TIBC, IRON, RETICCTPCT in the last 72 hours. Urine analysis:    Component Value Date/Time   COLORURINE COLORLESS (A) 12/15/2008 2016   APPEARANCEUR CLEAR 12/15/2008 2016   LABSPEC 1.005 12/15/2008 2016   PHURINE 6.5 12/15/2008 2016   GLUCOSEU NEGATIVE 12/15/2008 2016   HGBUR NEGATIVE 12/15/2008 2016   [place] NEGATIVE 12/15/2008 2016   KETONESUR NEGATIVE 12/15/2008 2016   PROTEINUR NEGATIVE 12/15/2008 2016   UROBILINOGEN 0.2 12/15/2008 2016   NITRITE NEGATIVE 12/15/2008 2016   LEUKOCYTESUR  12/15/2008 2016    NEGATIVE MICROSCOPIC NOT DONE ON URINES WITH NEGATIVE PROTEIN, BLOOD, LEUKOCYTES, NITRITE, OR GLUCOSE <1000 mg/dL.   Sepsis Labs: Invalid input(s): PROCALCITONIN,  LACTICIDVEN   Time coordinating discharge: 45 minutes  SIGNED:  Mercy Riding, MD  [facility] 08/06/2021, 3:11 PM

## 2021-08-06 NOTE — Progress Notes (Signed)
Received a phone call from tele at 1300, tele reported that pt 's HR was in the 150s and pt had came off of tele. Walked into pt room, pt was no where to be found, IV was found on the floor.
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
## 2021-08-09 LAB — CULTURE, BLOOD (ROUTINE X 2)
Culture: NO GROWTH
Culture: NO GROWTH
Special Requests: ADEQUATE

## 2021-09-21 ENCOUNTER — Other Ambulatory Visit (HOSPITAL_BASED_OUTPATIENT_CLINIC_OR_DEPARTMENT_OTHER): Payer: Self-pay

## 2021-09-21 MED ORDER — BUPRENORPHINE HCL-NALOXONE HCL 2-0.5 MG SL FILM
ORAL_FILM | SUBLINGUAL | 0 refills | Status: DC
Start: 2021-09-21 — End: 2023-04-16
  Filled 2021-09-21: qty 17, 7d supply, fill #0

## 2022-04-12 IMAGING — DX DG ELBOW COMPLETE 3+V*L*
4 series · 4 of 4 positions shown · non-contrast
Comparison: None.

CLINICAL DATA: Arm infection.

EXAM:
LEFT ELBOW - COMPLETE 3+ VIEW

[elbow ap]
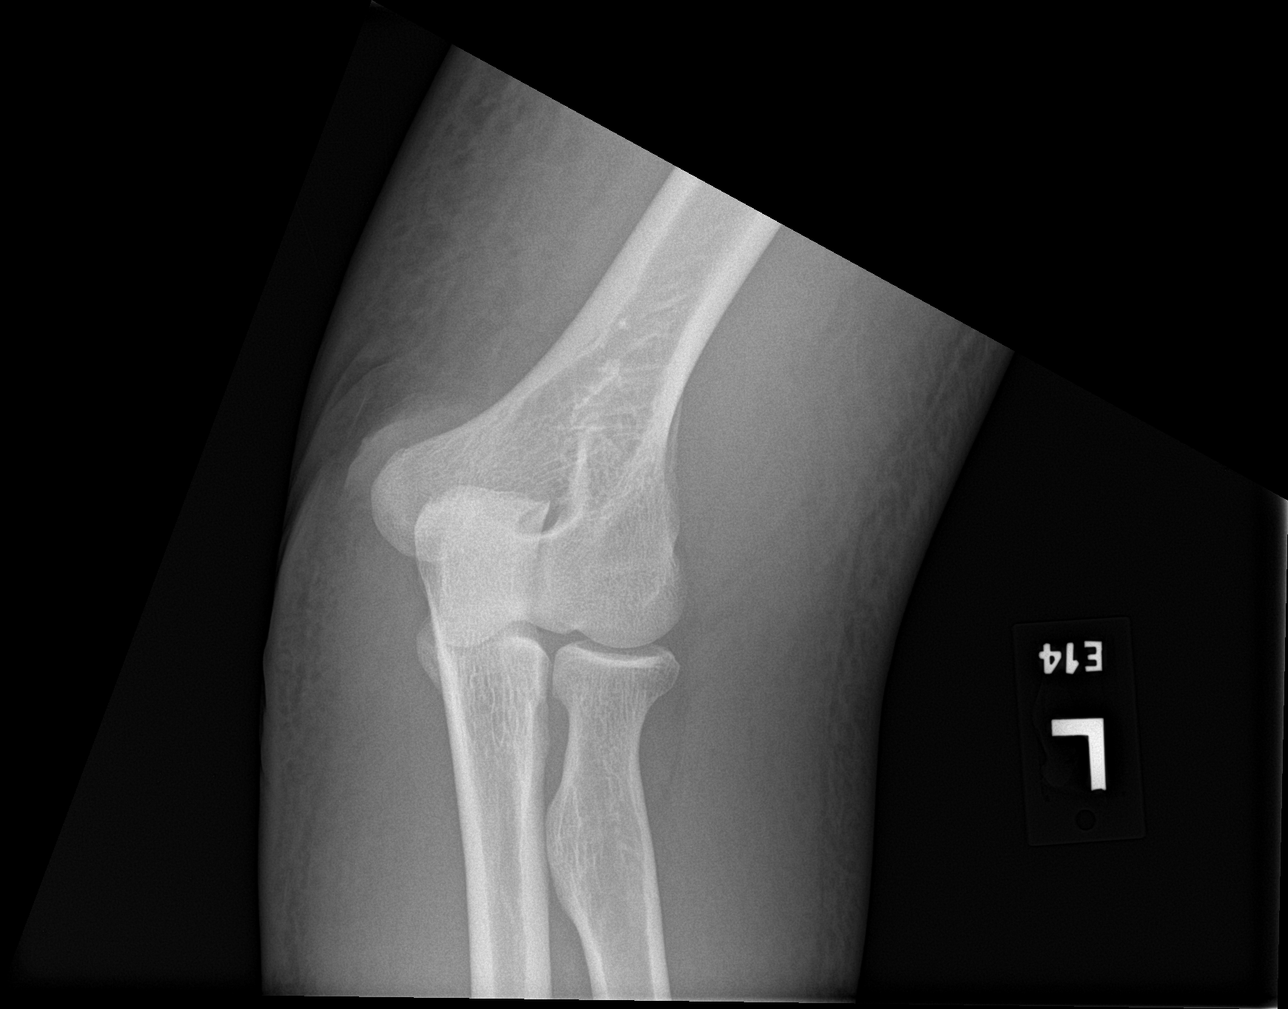

[elbow obl (1 of 2)]
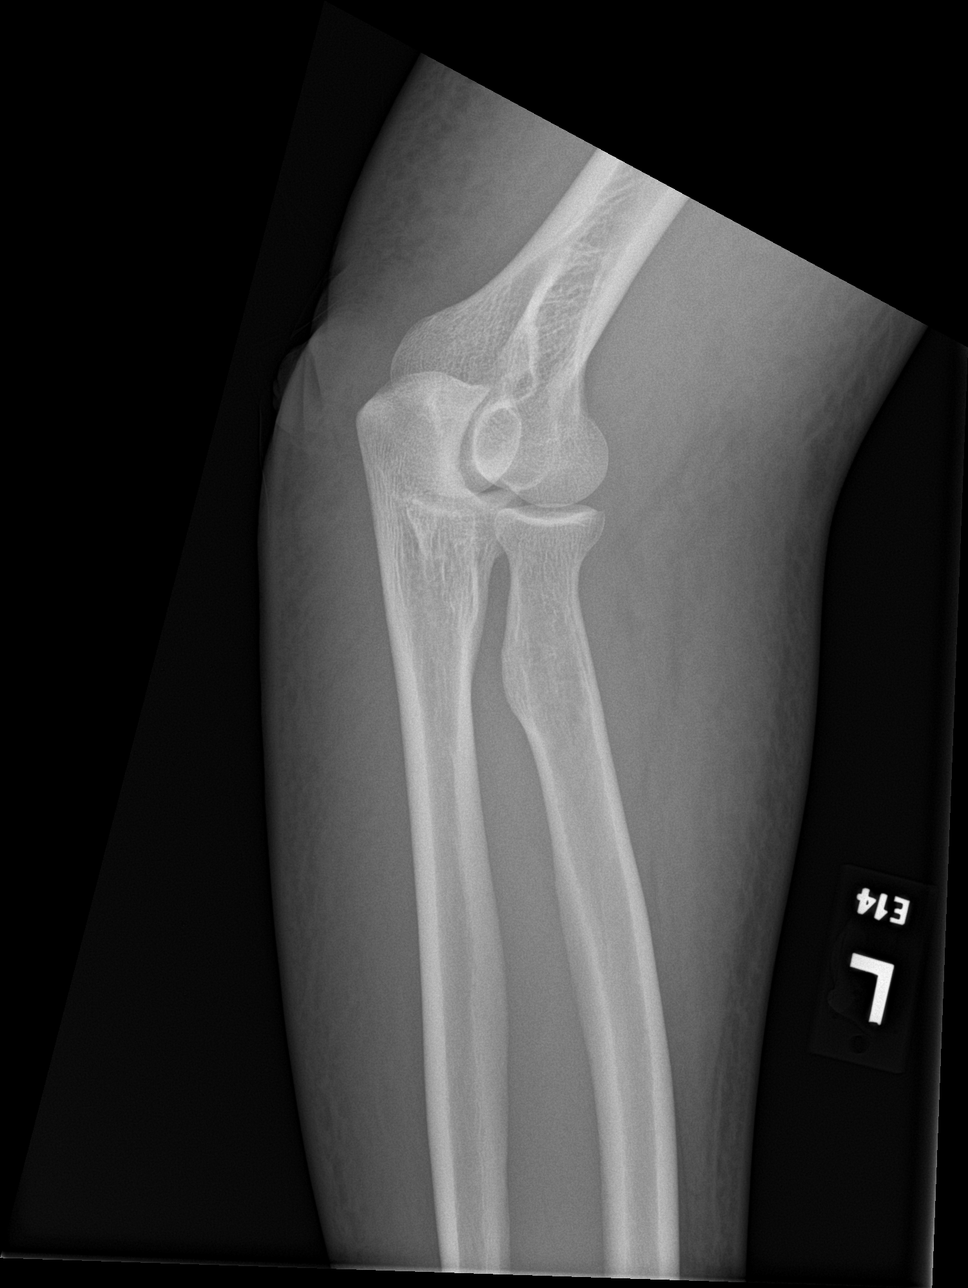

[elbow obl (2 of 2)]
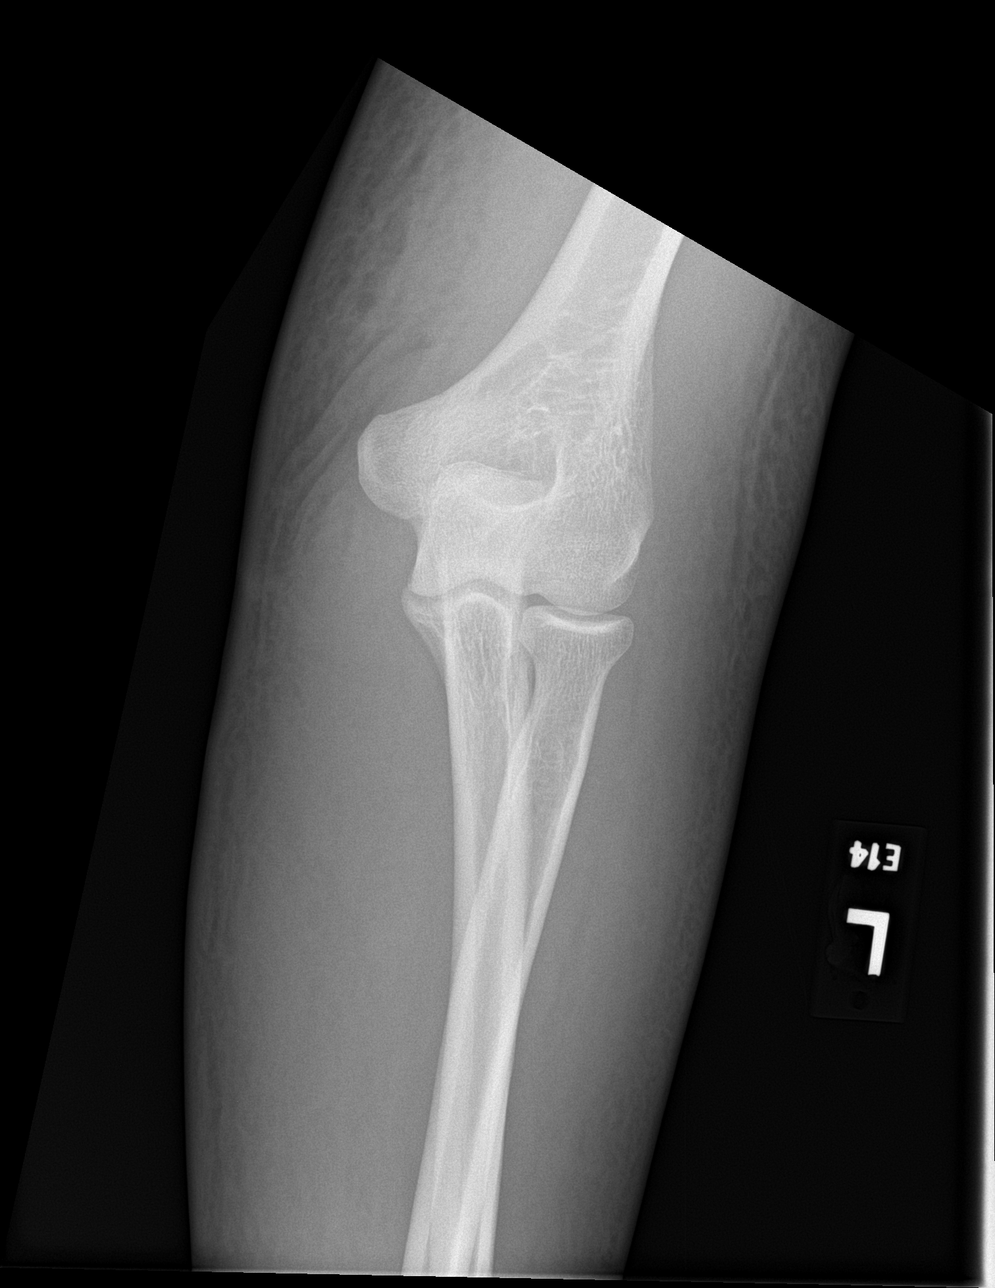

[elbow lat]
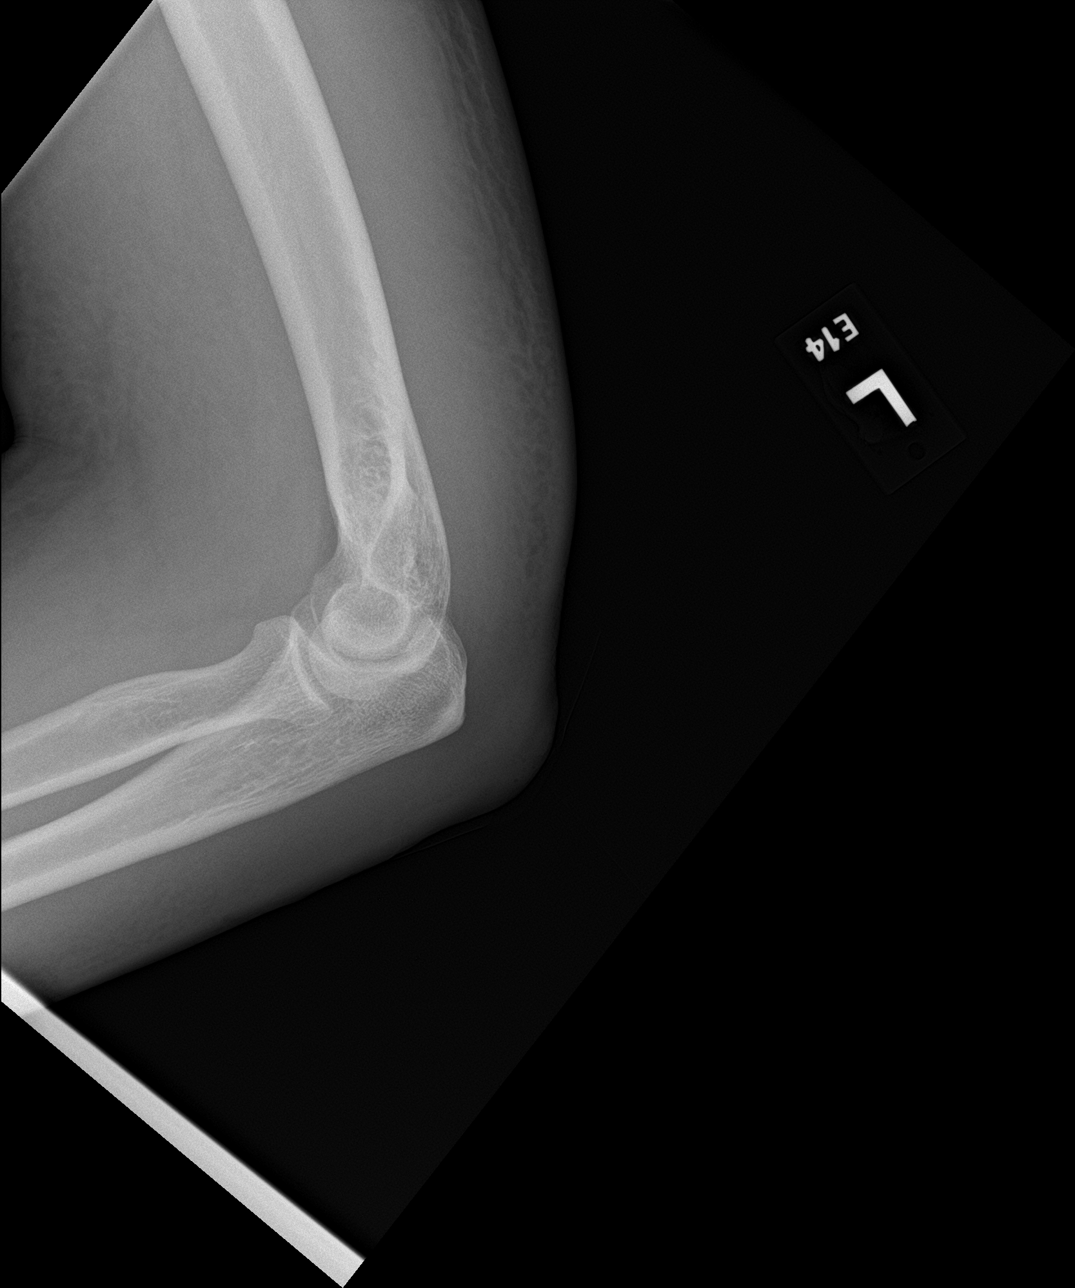

[4 of 4 positions shown; findings below may reference images not displayed]

FINDINGS: There is no evidence of fracture, dislocation, or joint effusion.
There is no evidence of arthropathy or other focal bone abnormality.
Diffuse soft tissue swelling.
IMPRESSION: 1. Diffuse soft tissue swelling.
2. No radiographic evidence of osteomyelitis.

## 2022-04-12 IMAGING — DX DG CHEST 2V
2 series · 2 of 2 positions shown · non-contrast
Comparison: April 15, 2018

CLINICAL DATA: Arm infection.

EXAM:
CHEST - 2 VIEW

[chest pa]
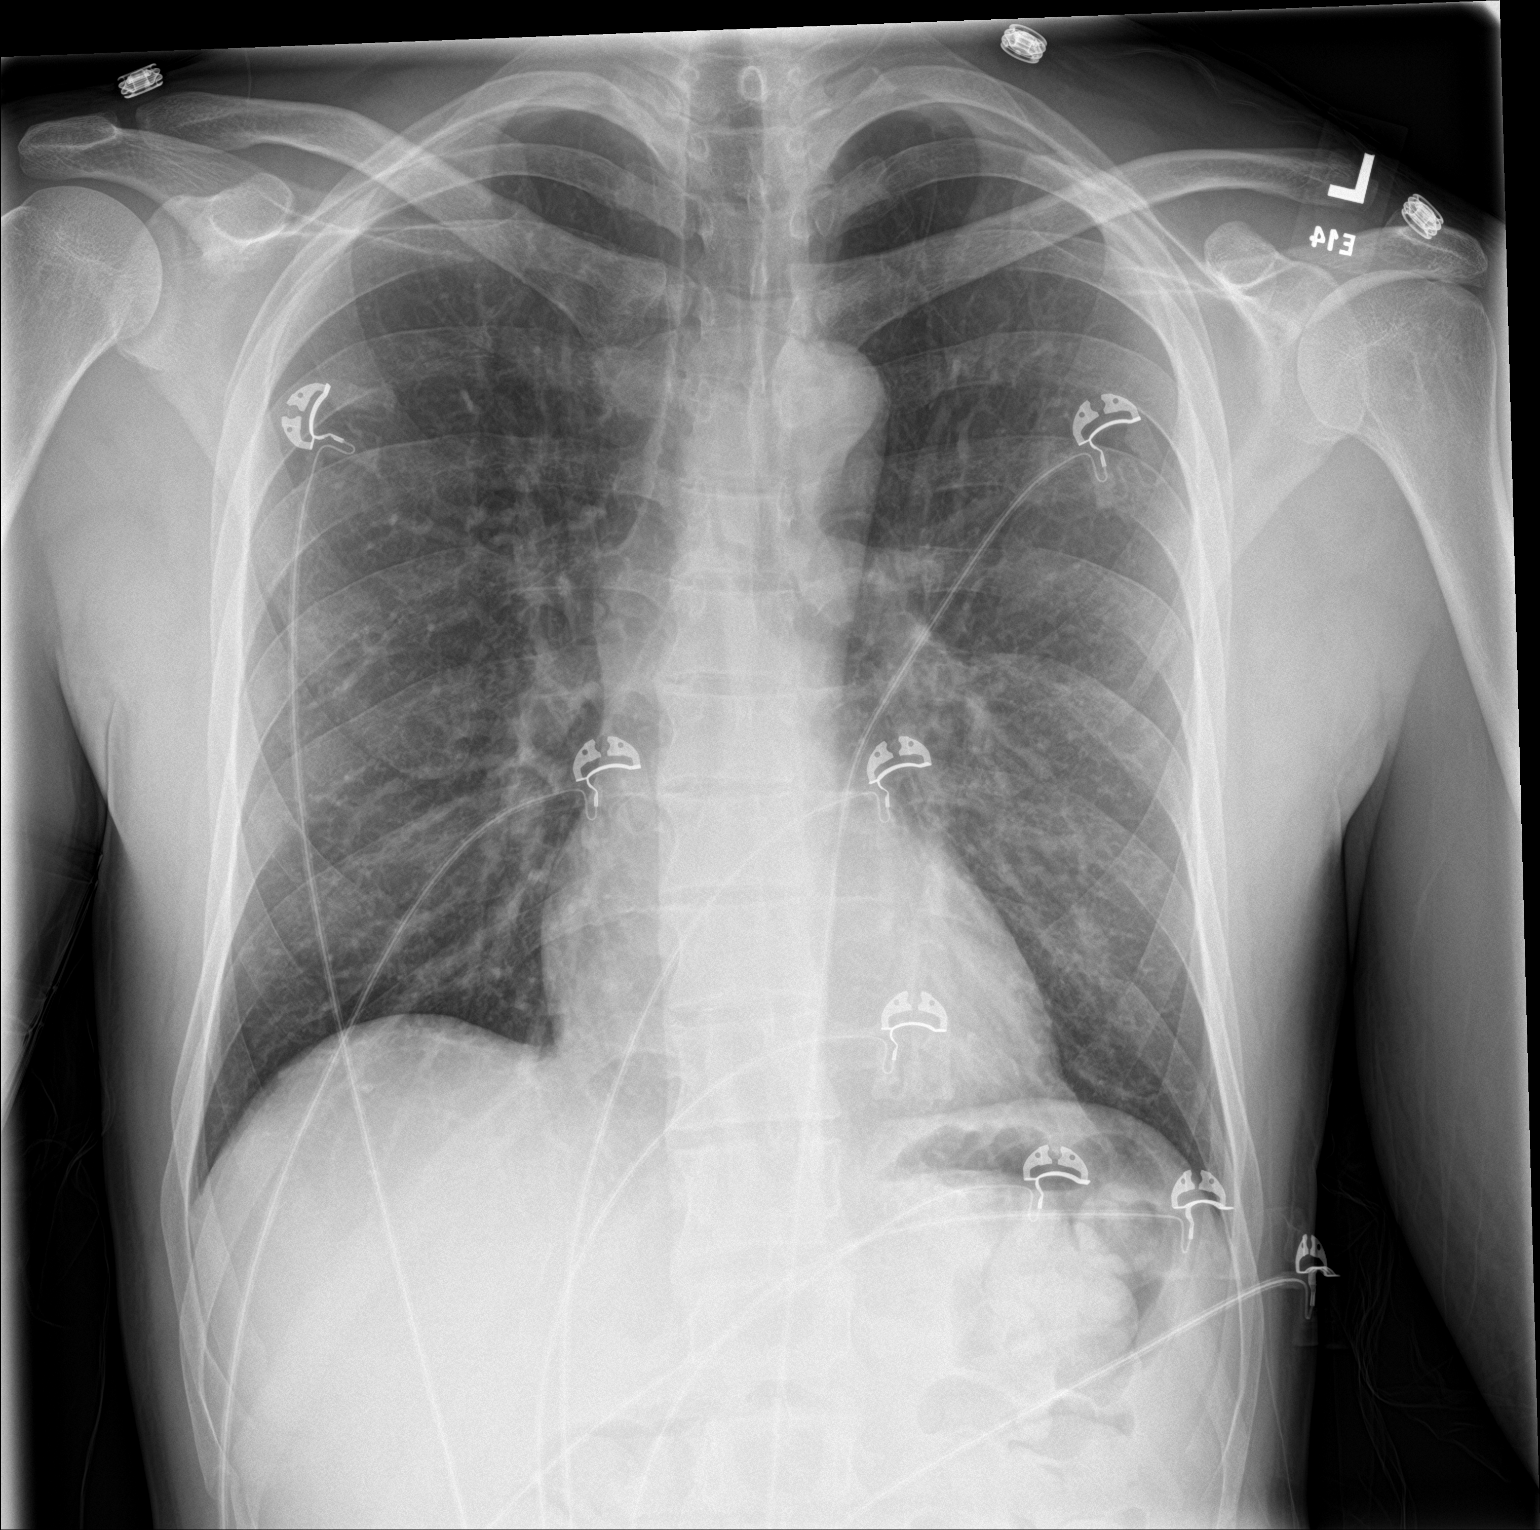

[chest lat]
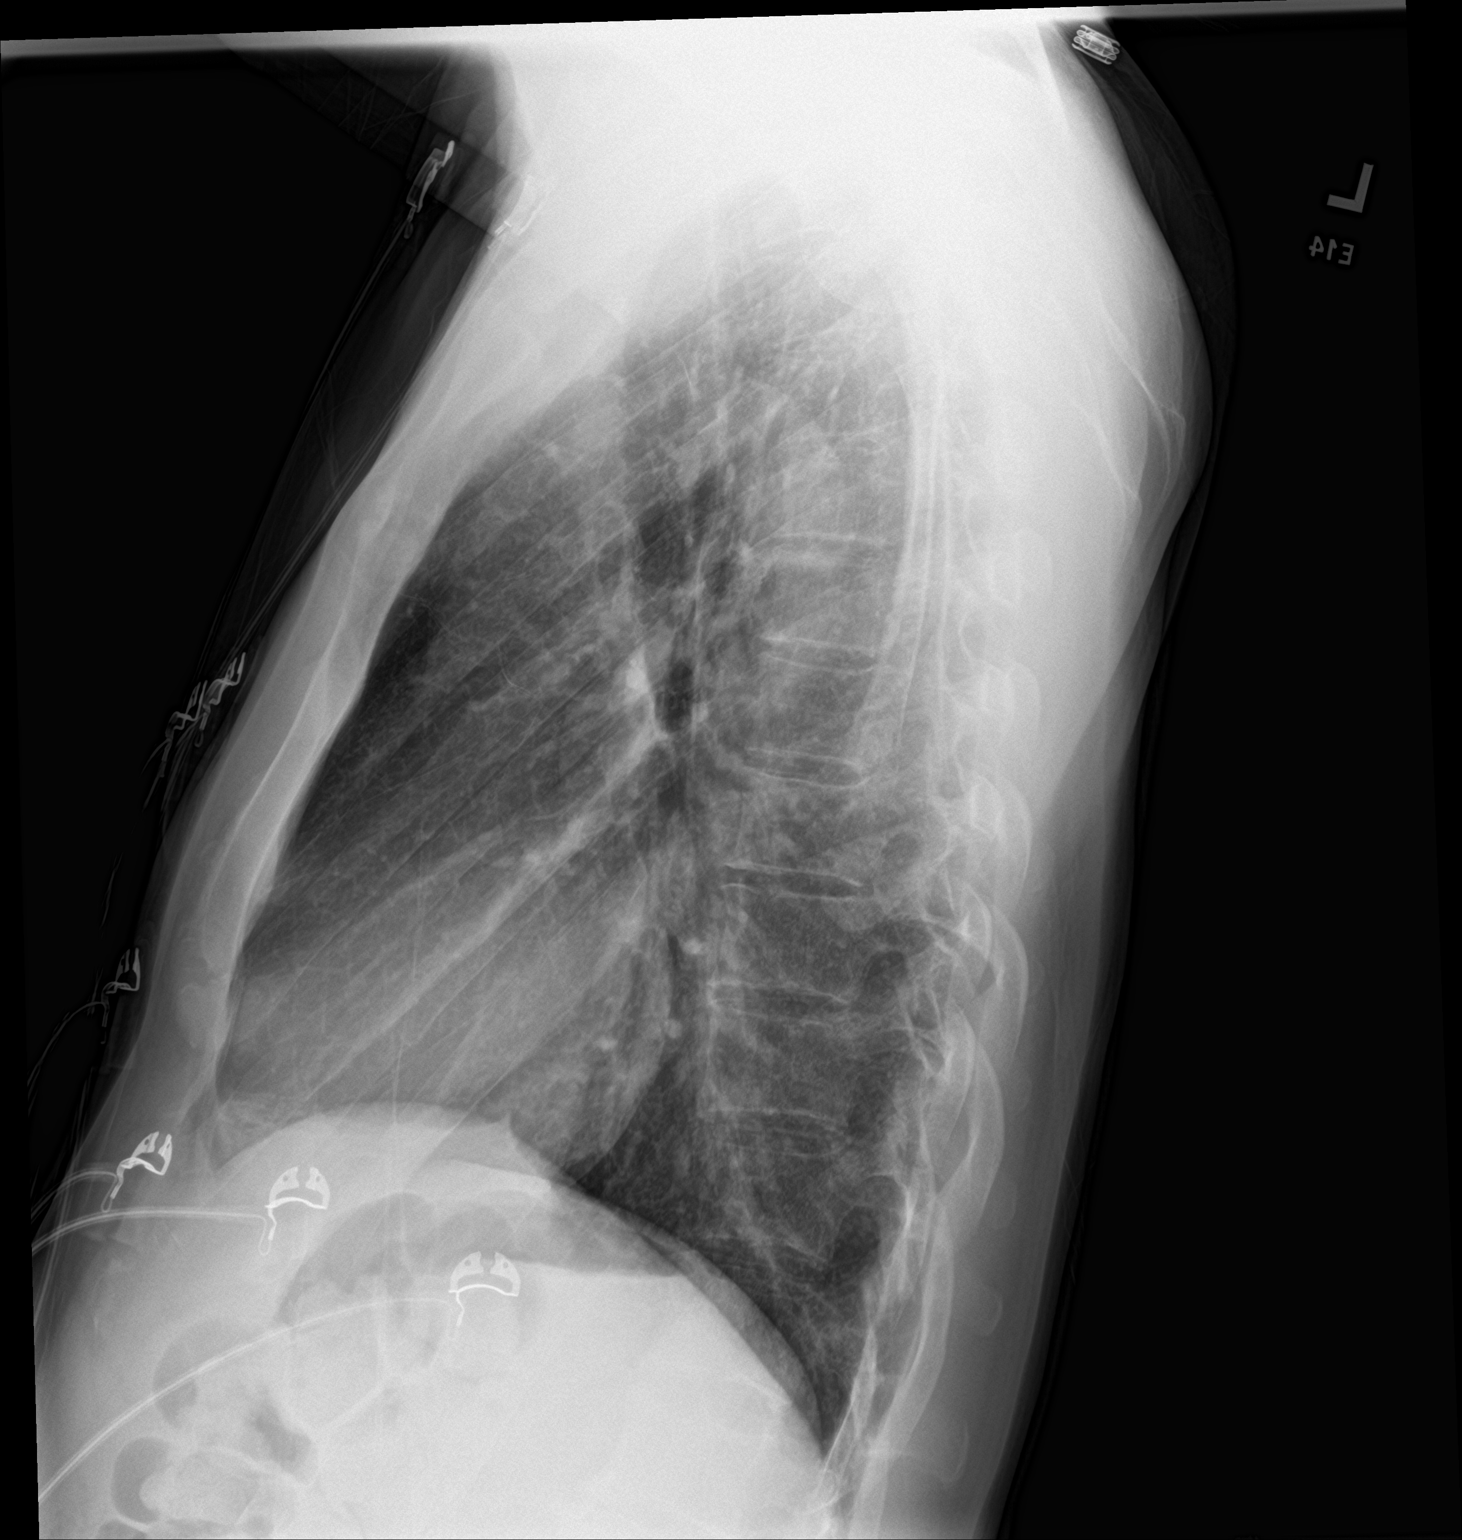

[2 of 2 positions shown; findings below may reference images not displayed]

FINDINGS: The heart size and mediastinal contours are within normal limits.
Both lungs are clear. The visualized skeletal structures are
unremarkable.
IMPRESSION: No active cardiopulmonary disease.
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
## 2023-04-01 NOTE — Procedures (Signed)
 Orthopedic Injury Treatment - Lower Extremity  Date/Time: 04/01/2023 7:05 AM  Performed by: Joesph Almarie Ruts, MD Authorized by: Everet DELENA Don, MD   Consent:    Consent obtained:  Verbal   Consent given by:  Patient   Risks, benefits, and alternatives were discussed: yes     Risks discussed:  Restricted joint movement   Alternatives discussed:  No treatment Universal protocol:    Procedure explained and questions answered to patient or proxy's satisfaction: yes     Imaging studies available: yes     Patient identity confirmed:  Verbally with patient Location:    Location:  Ankle   Ankle injury location:  R ankle Pre-procedure details:    Distal perfusion: normal     Range of motion: reduced   Sedation:    Sedation type:  None Anesthesia:    Anesthesia method:  None Procedure details:    Manipulation performed: yes     Immobilization:  Splint   Splint type:  Short leg   Supplies used:  Plaster and cotton padding Post-procedure details:    Neurological function: normal     Distal perfusion: normal     Range of motion: unchanged     Procedure completion:  Tolerated  Electronically signed by:  Joesph Almarie Ruts, MD 04/01/2023 7:06 AM
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
## 2023-04-16 ENCOUNTER — Emergency Department (HOSPITAL_BASED_OUTPATIENT_CLINIC_OR_DEPARTMENT_OTHER): Payer: MEDICAID

## 2023-04-16 ENCOUNTER — Other Ambulatory Visit: Payer: Self-pay

## 2023-04-16 ENCOUNTER — Inpatient Hospital Stay (HOSPITAL_BASED_OUTPATIENT_CLINIC_OR_DEPARTMENT_OTHER)
Admission: EM | Admit: 2023-04-16 | Discharge: 2023-04-17 | DRG: 603 | Discharge status: Against Medical Advice | Payer: MEDICAID | Attending: Internal Medicine | Admitting: Internal Medicine

## 2023-04-16 ENCOUNTER — Encounter (HOSPITAL_BASED_OUTPATIENT_CLINIC_OR_DEPARTMENT_OTHER): Payer: Self-pay | Admitting: Emergency Medicine

## 2023-04-16 DIAGNOSIS — Z5329 Procedure and treatment not carried out because of patient's decision for other reasons: Secondary | ICD-10-CM | POA: Diagnosis present

## 2023-04-16 DIAGNOSIS — F17210 Nicotine dependence, cigarettes, uncomplicated: Secondary | ICD-10-CM | POA: Diagnosis present

## 2023-04-16 DIAGNOSIS — Z79899 Other long term (current) drug therapy: Secondary | ICD-10-CM

## 2023-04-16 DIAGNOSIS — Z87898 Personal history of other specified conditions: Secondary | ICD-10-CM

## 2023-04-16 DIAGNOSIS — D72829 Elevated white blood cell count, unspecified: Secondary | ICD-10-CM

## 2023-04-16 DIAGNOSIS — L03113 Cellulitis of right upper limb: Secondary | ICD-10-CM | POA: Diagnosis present

## 2023-04-16 DIAGNOSIS — D631 Anemia in chronic kidney disease: Secondary | ICD-10-CM | POA: Diagnosis present

## 2023-04-16 DIAGNOSIS — Z91041 Radiographic dye allergy status: Secondary | ICD-10-CM | POA: Diagnosis not present

## 2023-04-16 DIAGNOSIS — Z888 Allergy status to other drugs, medicaments and biological substances status: Secondary | ICD-10-CM | POA: Diagnosis not present

## 2023-04-16 DIAGNOSIS — B182 Chronic viral hepatitis C: Secondary | ICD-10-CM | POA: Diagnosis present

## 2023-04-16 DIAGNOSIS — Z59819 Housing instability, housed unspecified: Secondary | ICD-10-CM

## 2023-04-16 DIAGNOSIS — F1110 Opioid abuse, uncomplicated: Secondary | ICD-10-CM | POA: Diagnosis present

## 2023-04-16 DIAGNOSIS — Z5902 Unsheltered homelessness: Secondary | ICD-10-CM | POA: Diagnosis not present

## 2023-04-16 DIAGNOSIS — E8809 Other disorders of plasma-protein metabolism, not elsewhere classified: Secondary | ICD-10-CM | POA: Diagnosis present

## 2023-04-16 DIAGNOSIS — F1111 Opioid abuse, in remission: Secondary | ICD-10-CM | POA: Diagnosis present

## 2023-04-16 DIAGNOSIS — R030 Elevated blood-pressure reading, without diagnosis of hypertension: Secondary | ICD-10-CM | POA: Diagnosis present

## 2023-04-16 DIAGNOSIS — Z720 Tobacco use: Secondary | ICD-10-CM | POA: Diagnosis present

## 2023-04-16 DIAGNOSIS — K739 Chronic hepatitis, unspecified: Secondary | ICD-10-CM | POA: Diagnosis present

## 2023-04-16 DIAGNOSIS — D649 Anemia, unspecified: Secondary | ICD-10-CM | POA: Diagnosis present

## 2023-04-16 LAB — COMPREHENSIVE METABOLIC PANEL
ALT: 18 U/L (ref 0–44)
AST: 19 U/L (ref 15–41)
Albumin: 3.4 g/dL — ABNORMAL LOW (ref 3.5–5.0)
Alkaline Phosphatase: 107 U/L (ref 38–126)
Anion gap: 7 (ref 5–15)
BUN: 13 mg/dL (ref 6–20)
CO2: 27 mmol/L (ref 22–32)
Calcium: 8.6 mg/dL — ABNORMAL LOW (ref 8.9–10.3)
Chloride: 101 mmol/L (ref 98–111)
Creatinine, Ser: 0.61 mg/dL (ref 0.61–1.24)
GFR, Estimated: >60 mL/min (ref >60)
Glucose, Bld: 111 mg/dL — ABNORMAL HIGH (ref 70–99)
Potassium: 3.7 mmol/L (ref 3.5–5.1)
Sodium: 135 mmol/L (ref 135–145)
Total Bilirubin: 0.4 mg/dL (ref 0.3–1.2)
Total Protein: 7.2 g/dL (ref 6.5–8.1)

## 2023-04-16 LAB — CBC
HCT: 33.6 % — ABNORMAL LOW (ref 39.0–52.0)
Hemoglobin: 10.8 g/dL — ABNORMAL LOW (ref 13.0–17.0)
MCH: 28.5 pg (ref 26.0–34.0)
MCHC: 32.1 g/dL (ref 30.0–36.0)
MCV: 88.7 fL (ref 80.0–100.0)
Platelets: 284 10*3/uL (ref 150–400)
RBC: 3.79 MIL/uL — ABNORMAL LOW (ref 4.22–5.81)
RDW: 13.2 % (ref 11.5–15.5)
WBC: 14.2 10*3/uL — ABNORMAL HIGH (ref 4.0–10.5)
nRBC: 0 % (ref 0.0–0.2)

## 2023-04-16 LAB — MAGNESIUM: Magnesium: 1.9 mg/dL (ref 1.7–2.4)

## 2023-04-16 LAB — LACTIC ACID, PLASMA: Lactic Acid, Venous: 1.1 mmol/L (ref 0.5–1.9)

## 2023-04-16 MED ORDER — TRAMADOL HCL 50 MG PO TABS: 50.0000 mg | ORAL_TABLET | Freq: Four times a day (QID) | ORAL | Status: DC | PRN

## 2023-04-16 MED ORDER — KETOROLAC TROMETHAMINE 15 MG/ML IJ SOLN
15.0000 mg | Freq: Once | INTRAMUSCULAR | Status: AC
  Administered 2023-04-16: 15 mg via INTRAVENOUS
  Filled 2023-04-16: qty 1

## 2023-04-16 MED ORDER — TRAMADOL HCL 50 MG PO TABS
50.0000 mg | ORAL_TABLET | Freq: Four times a day (QID) | ORAL | Status: DC | PRN
  Administered 2023-04-16 – 2023-04-17 (×2): 50 mg via ORAL
  Filled 2023-04-16 (×2): qty 1

## 2023-04-16 MED ORDER — ONDANSETRON HCL 4 MG/2ML IJ SOLN: 4.0000 mg | Freq: Four times a day (QID) | INTRAMUSCULAR | Status: DC | PRN

## 2023-04-16 MED ORDER — SODIUM CHLORIDE 0.9 % IV SOLN: INTRAVENOUS | Status: DC

## 2023-04-16 MED ORDER — KETOROLAC TROMETHAMINE 30 MG/ML IJ SOLN
30.0000 mg | Freq: Four times a day (QID) | INTRAMUSCULAR | Status: DC | PRN
  Administered 2023-04-16 – 2023-04-17 (×3): 30 mg via INTRAVENOUS
  Filled 2023-04-16 (×3): qty 1

## 2023-04-16 MED ORDER — SODIUM CHLORIDE 0.9 % IV BOLUS
1000.0000 mL | Freq: Once | INTRAVENOUS | Status: AC
  Administered 2023-04-16: 1000 mL via INTRAVENOUS

## 2023-04-16 MED ORDER — IBUPROFEN 400 MG PO TABS
400.0000 mg | ORAL_TABLET | Freq: Once | ORAL | Status: AC
  Administered 2023-04-16: 400 mg via ORAL
  Filled 2023-04-16: qty 1

## 2023-04-16 MED ORDER — VANCOMYCIN HCL 1250 MG/250ML IV SOLN
1250.0000 mg | Freq: Two times a day (BID) | INTRAVENOUS | Status: DC
  Administered 2023-04-16 – 2023-04-17 (×2): 1250 mg via INTRAVENOUS
  Filled 2023-04-16 (×2): qty 250

## 2023-04-16 MED ORDER — ONDANSETRON HCL 4 MG PO TABS: 4.0000 mg | ORAL_TABLET | Freq: Four times a day (QID) | ORAL | Status: DC | PRN

## 2023-04-16 MED ORDER — ACETAMINOPHEN 325 MG PO TABS
650.0000 mg | ORAL_TABLET | Freq: Four times a day (QID) | ORAL | Status: DC | PRN
  Filled 2023-04-16: qty 2

## 2023-04-16 MED ORDER — SODIUM CHLORIDE 0.9 % IV SOLN: Freq: Once | INTRAVENOUS | Status: AC

## 2023-04-16 MED ORDER — VANCOMYCIN HCL IN DEXTROSE 1-5 GM/200ML-% IV SOLN
1000.0000 mg | Freq: Once | INTRAVENOUS | Status: AC
  Administered 2023-04-16: 1000 mg via INTRAVENOUS
  Filled 2023-04-16: qty 200

## 2023-04-16 MED ORDER — ACETAMINOPHEN 650 MG RE SUPP: 650.0000 mg | Freq: Four times a day (QID) | RECTAL | Status: DC | PRN

## 2023-04-16 MED ORDER — SODIUM CHLORIDE 0.9 % IV SOLN
2.0000 g | Freq: Once | INTRAVENOUS | Status: AC
  Administered 2023-04-16: 2 g via INTRAVENOUS
  Filled 2023-04-16: qty 12.5

## 2023-04-16 MED ORDER — BUPRENORPHINE HCL-NALOXONE HCL 8-2 MG SL SUBL
1.0000 | SUBLINGUAL_TABLET | Freq: Every day | SUBLINGUAL | Status: DC
Start: 2023-04-16 — End: 2023-04-16

## 2023-04-16 MED ORDER — BUPRENORPHINE HCL-NALOXONE HCL 8-2 MG SL SUBL
1.0000 | SUBLINGUAL_TABLET | Freq: Every day | SUBLINGUAL | Status: DC
  Filled 2023-04-16: qty 1

## 2023-04-16 MED ORDER — SODIUM CHLORIDE 0.9 % IV SOLN
1.0000 g | INTRAVENOUS | Status: DC
  Administered 2023-04-16: 1 g via INTRAVENOUS
  Filled 2023-04-16: qty 10

## 2023-04-16 NOTE — ED Notes (Signed)
[facility] at bedside 

## 2023-04-16 NOTE — ED Notes (Signed)
 Report given to [facility]. 

## 2023-04-16 NOTE — Progress Notes (Signed)
The patient arrived to the unit. He is alert, oriented x4, lethargic easy to arouse, ambulatory 1 assist for safety. He complains of pain in RUE and RLE 10/10, both are edematous.

## 2023-04-16 NOTE — H&P (Signed)
History and Physical    Patient: Caleb Bush ZOX:[id number] DOB: October 14, 1980 DOA: 04/16/2023 DOS: the patient was seen and examined on 04/16/2023 PCP: System, Provider Not In  Patient coming from: Home  Chief Complaint:  Chief Complaint  Patient presents with   Arm Swelling   HPI: Caleb Bush is a 42 y.o. male with medical history significant of GERD, hepatitis C, substance abuse, fractured mandible who presented to the emergency department with LUE tenderness, edema and erythema since this morning.  He stated that he felt something crawling his right shoulder last night while camping. He denied fever, chills, rhinorrhea, sore throat, wheezing or hemoptysis.  No chest pain, palpitations, diaphoresis, PND, orthopnea or pitting edema of the lower extremities.  No abdominal pain, nausea, emesis, diarrhea, constipation, melena or hematochezia.  No flank pain, dysuria, frequency or hematuria.  No polyuria, polydipsia, polyphagia or blurred vision.   Lab work: CBC showed a white count of 14.2, Monnin 10.8 g/dL platelets 811.  Magnesium 1.9 mg/dL.  CMP showed a glucose of 111 mg/dL and albumin 3.4 g/dL.  The rest of the CMP measurements are normal after calcium correction.   Imaging: Right forearm x-ray with no acute bony abnormality.  There is diffuse soft tissue edema.  CT of the right forearm with subcutaneous edema from the elbow to the wrist and the forearm, with mild volar radial sparing.  No abnormal gas or compelling findings for abscess.  Prominence of stool in the colon on the scout images raising the possibility of constipation.   ED course: Initial vital signs were temperature 98.6 F, pulse 71, respirations 16, BP 122/83 mmHg O2 sat 100% on room air.  The patient received ibuprofen 400 mg p.o., Toradol 15 mg IVP x 1, 1000 mL normal saline bolus, vancomycin and cefepime.  Review of Systems: As mentioned in the history of present illness. All other systems reviewed and are  negative. Past Medical History:  Diagnosis Date   GERD (gastroesophageal reflux disease)    Hepatitis C    History of substance abuse (HCC)    Mandible fracture (HCC) 07/18/2017   Teeth missing    Past Surgical History:  Procedure Laterality Date   CLOSED REDUCTION MANDIBLE Right 07/18/2017   Procedure: CLOSED REDUCTION CONDYLAR FRACTURE;  Surgeon: Vivia Ewing, DMD;  Location: [facility];  Service: Oral Surgery;  Laterality: Right;   HIP SURGERY     MANDIBULAR HARDWARE REMOVAL N/A 09/15/2017   Procedure: MANDIBULAR HARDWARE REMOVAL;  Surgeon: Vivia Ewing, DMD;  Location: [facility];  Service: Oral Surgery;  Laterality: N/A;   MASS EXCISION Left 09/15/2017   Procedure: BIOPSY OF TISSUE LEFT MANDIBLE;  Surgeon: Vivia Ewing, DMD;  Location: [facility];  Service: Oral Surgery;  Laterality: Left;   MULTIPLE EXTRACTIONS WITH ALVEOLOPLASTY Bilateral 07/18/2017   Procedure: MULTIPLE EXTRACTION OF # 1, 14, 19, 30 & 31;  Surgeon: Vivia Ewing, DMD;  Location: [facility];  Service: Oral Surgery;  Laterality: Bilateral;   ORIF MANDIBULAR FRACTURE Left 07/18/2017   Procedure: OPEN REDUCTION INTERNAL FIXATION (ORIF) MANDIBULAR FRACTURE;  Surgeon: Vivia Ewing, DMD;  Location: [facility];  Service: Oral Surgery;  Laterality: Left;   Social History:  reports that he has been smoking cigarettes. He has never used smokeless tobacco. He reports that he does not currently use drugs after having used the following drugs: IV. He reports that he does not drink alcohol.  Allergies  Allergen Reactions   Clonidine Derivatives Other (See Comments)  UNKNOWN   Ivp Dye [Iodinated Contrast Media] Other (See Comments)    UNKNOWN    History reviewed. No pertinent family history.  Prior to Admission medications   Medication Sig Start Date End Date Taking? Authorizing Provider  amLODipine (NORVASC) 5 MG tablet Take 1 tablet (5 mg total) by mouth daily. Patient not taking: Reported on 08/04/2021  12/05/20   Linwood Dibbles, MD  naloxone [facility]) nasal spray 4 mg/0.1 mL Place 0.4 mg into the nose once as needed (Overdose.). 03/03/23   [provider]  SUBOXONE 8-2 MG FILM Place 1 Film under the tongue 3 (three) times daily. 03/10/23   [provider]    Physical Exam: Vitals:   04/16/23 1218 04/16/23 1230 04/16/23 1255 04/16/23 1345  BP:  124/83 129/83 133/77  Pulse:  (!) 57 63 (!) 43  Resp:   16 16  Temp: 97.9 F (36.6 C)   (!) 97.5 F (36.4 C)  TempSrc: Oral   Oral  SpO2:  100% 100% 98%  Weight:      Height:       Physical Exam Vitals and nursing note reviewed.  Constitutional:      General: He is awake. He is not in acute distress.    Appearance: Normal appearance.  HENT:     Head: Normocephalic.     Mouth/Throat:     Mouth: Mucous membranes are moist.  Eyes:     General: No scleral icterus.    Pupils: Pupils are equal, round, and reactive to light.  Neck:     Vascular: No JVD.  Cardiovascular:     Rate and Rhythm: Normal rate and regular rhythm.     Heart sounds: S1 normal and S2 normal.  Pulmonary:     Effort: Pulmonary effort is normal.     Breath sounds: Normal breath sounds.  Abdominal:     General: Bowel sounds are normal.     Palpations: Abdomen is soft.  Musculoskeletal:     Right forearm: Swelling, edema and tenderness present.     Cervical back: Neck supple.     Right lower leg: No edema.     Left lower leg: No edema.  Skin:    General: Skin is warm and dry.  Neurological:     General: No focal deficit present.     Mental Status: He is alert and oriented to person, place, and time.  Psychiatric:        Mood and Affect: Mood normal.        Behavior: Behavior normal. Behavior is cooperative.        Data Reviewed:  Results are pending, will review when available.  Assessment and Plan: Principal Problem:   Cellulitis of right arm Inpatient/MedSurg. Continue IV fluids. Analgesics as needed. Antiemetics as needed. Continue  vancomycin per pharmacy. Ceftriaxone 1 g IVPB daily. Follow CBC, CMP and lipase in AM.  Active Problems:   Heroin abuse (HCC) Last use yesterday. According to the patient did not inject in right arm. Will resume Suboxone while the patient is in the hospital.    Normocytic anemia Monitor hematocrit and hemoglobin.    Hypoalbuminemia In the setting of acute infection.    Elevated blood pressure, situational No longer on amlodipine. Continue to monitor blood pressure.    Chronic hepatitis Monitor LFTs. Follow-up with hepatology as an outpatient.   Advance Care Planning:   Code Status: Full Code   Consults:   Family Communication:   Severity of Illness: The appropriate patient  status for this patient is INPATIENT. Inpatient status is judged to be reasonable and necessary in order to provide the required intensity of service to ensure the patient's safety. The patient's presenting symptoms, physical exam findings, and initial radiographic and laboratory data in the context of their chronic comorbidities is felt to place them at high risk for further clinical deterioration. Furthermore, it is not anticipated that the patient will be medically stable for discharge from the hospital within 2 midnights of admission.   * I certify that at the point of admission it is my clinical judgment that the patient will require inpatient hospital care spanning beyond 2 midnights from the point of admission due to high intensity of service, high risk for further deterioration and high frequency of surveillance required.*  Author: Bobette Mo, MD 04/16/2023 1:47 PM  For on call review [email or web address].   This document was prepared using Dragon voice recognition software and may contain some unintended transcription errors.

## 2023-04-16 NOTE — ED Notes (Signed)
2nd RN attempted IV start but was unsuccessful. EDP informed

## 2023-04-16 NOTE — Progress Notes (Signed)
Plan of Care Note for accepted transfer   Patient: Caleb Bush MRN: [id number]   DOA: 04/16/2023  Facility requesting transfer: [facility].  Requesting Provider: Cathren Laine, MD. Reason for transfer: Cellulitis right arm. Facility course:  Issachar Broady is a 42 y.o. male.   Pt c/o swelling/redness to right forearm for past two days. States homeless, was sleeping in woods, states two nights ago felt bug crawling on left shoulder, but denies specific bite, sting, trauma, fall or injury. Hx ivda, but denies any recent ivda, takes suboxone. Denies fever/chills. No hand numbness/weakness. No other area of pain, redness, or swelling.   Plan of care: The patient is accepted for admission to Med-surg  unit, at [facility].  The patient received analgesics, vancomycin and cefepime.  He has been admitted to continue IV antibiotics for right arm cellulitis.   Author: Bobette Mo, MD 04/16/2023  Check [email or web address] for on-call coverage.  Nursing staff, Please call [facility] Admits & Consults System-Wide number on Amion as soon as patient's arrival, so appropriate admitting provider can evaluate the pt.

## 2023-04-16 NOTE — Progress Notes (Signed)
Pharmacy Antibiotic Note  Caleb Bush is a 42 y.o. male admitted on 04/16/2023 with cellulitis.  Pharmacy has been consulted for vancomycin dosing.  Plan: Vancomycin 1000 mg IV x1 given in ED  then vancomycin 1250 mg IV q12h ( AUC 536, Scr round to 0.8, wt 66.5 kg)  Ceftriaxone 1 gr IV q24h ( MD)  Monitor clinical course, renal function, cultures as available   Height: 5\' 11"  (180.3 cm) Weight: 66.5 kg (146 lb 9.7 oz) IBW/kg (Calculated) : 75.3  Temp (24hrs), Avg:98 F (36.7 C), Min:97.5 F (36.4 C), Max:98.6 F (37 C)  Recent Labs  Lab 04/16/23 0922  WBC 14.2*  CREATININE 0.61  LATICACIDVEN 1.1    Estimated Creatinine Clearance: 112 mL/min (by C-G formula based on SCr of 0.61 mg/dL).    Allergies  Allergen Reactions   Clonidine Derivatives Other (See Comments)    UNKNOWN   Ivp Dye [Iodinated Contrast Media] Other (See Comments)    UNKNOWN    Antimicrobials this admission: 7/17 vancomycin >>  7/17 ceftriaxone >>    Dose adjustments this admission:    Microbiology results: 7/17 BCx:     Thank you for allowing pharmacy to be a part of this patient's care.   Adalberto Cole, PharmD, BCPS 04/16/2023 2:12 PM

## 2023-04-16 NOTE — ED Notes (Signed)
Unable to get IV. Hx of IV drug abuse. EDP informed

## 2023-04-16 NOTE — ED Notes (Addendum)
Attempted IV stick to left hand, unable to obtain.  Pt has multiple track marks.  Assessed veins with [place].  Limited access around arteries.  Will wait for additional staff for [place] IV insertion at 0900.  EDP made aware

## 2023-04-16 NOTE — TOC Initial Note (Signed)
Transition of Care [facility]) - Initial/Assessment Note    Patient Details  Name: Caleb Bush MRN: [id number] Date of Birth: [birth year]  [facility] of [facility] CM/SW Contact:    Lanier Clam, RN Phone Number: 04/16/2023, 4:46 PM  Clinical Narrative: Patient states has a place to go @ d/c-not homeless. ON:[id number] added.                  Expected Discharge Plan: Home/Self Care Barriers to Discharge: Continued Medical Work up   Patient Goals and CMS Choice Patient states their goals for this hospitalization and ongoing recovery are:: Home CMS Medicare.gov Compare Post Acute Care list provided to:: Patient Choice offered to / list presented to : Patient [place] ownership interest in [facility].provided to:: Patient    Expected Discharge Plan and Services   Discharge Planning Services: CM Consult   Living arrangements for the past 2 months: Single Family Home                                      Prior Living Arrangements/Services Living arrangements for the past 2 months: Single Family Home Lives with:: Self Patient language and need for interpreter reviewed:: Yes Do you feel safe going back to the place where you live?: Yes      Need for Family Participation in Patient Care: Yes (Comment) Care giver support system in place?: Yes (comment)   Criminal Activity/Legal Involvement Pertinent to Current Situation/Hospitalization: No - Comment as needed  Activities of Daily Living Home Assistive Devices/Equipment: None ADL Screening (condition at time of admission) Patient's cognitive ability adequate to safely complete daily activities?: No Is the patient deaf or have difficulty hearing?: No Does the patient have difficulty seeing, even when wearing glasses/contacts?: No Does the patient have difficulty concentrating, remembering, or making decisions?: Yes Patient able to express need for assistance with ADLs?: Yes Does the patient have  difficulty dressing or bathing?: No Independently performs ADLs?: Yes (appropriate for developmental age) Does the patient have difficulty walking or climbing stairs?: Yes Weakness of Legs: Both Weakness of Arms/Hands: Right  Permission Sought/Granted Permission sought to share information with : Case Manager Permission granted to share information with : Yes, Verbal Permission Granted  Share Information with NAME: Case Manager           Emotional Assessment Appearance:: Appears stated age Attitude/Demeanor/Rapport: Gracious Affect (typically observed): Accepting Orientation: : Oriented to Self, Oriented to Place, Oriented to  Time, Oriented to Situation Alcohol / Substance Use: Illicit Drugs Psych Involvement: No (comment)  Admission diagnosis:  Cellulitis of right arm [L03.113] Leukocytosis, unspecified type [D72.829] Housing instability [Z59.819] History of intravenous drug use [Z87.898] Patient Active Problem List   Diagnosis Date Noted   Cellulitis of right arm 04/16/2023   Cellulitis of left upper extremity 08/04/2021   Cellulitis 08/04/2021   Bilateral fracture of mandible, open, initial encounter (HCC) 07/17/2017   Heroin abuse (HCC) 04/06/2014   Polysubstance abuse (HCC) 04/06/2014   Altered mental status 04/06/2014   Hepatitis, chronic (HCC) 04/06/2014   Acute respiratory failure with hypoxia (HCC) 04/06/2014   PCP:  System, Provider Not In Pharmacy:   [facility] [place], [place] - [address] [place] [address] Phone: [phone] Fax: [phone]     Social Determinants of Health (SDOH) Social History: SDOH Screenings   Food Insecurity: Food Insecurity Present (04/16/2023)  Housing: High Risk (04/16/2023)  Transportation Needs: Unmet Transportation Needs (04/16/2023)  Utilities: At Risk (04/16/2023)  Social Connections: Unknown (02/05/2022)   [facility]  Tobacco Use: High Risk  (04/16/2023)   SDOH Interventions:     Readmission Risk Interventions     No data to display

## 2023-04-16 NOTE — ED Provider Notes (Addendum)
[facility] Provider Note   CSN: [id number] Arrival date & time: 04/16/23  0732     History  Chief Complaint  Patient presents with   Arm Swelling    Chantry Headen is a 42 y.o. male.  Pt c/o swelling/redness to right forearm for past two days. States homeless, was sleeping in woods, states two nights ago felt bug crawling on left shoulder, but denies specific bite, sting, trauma, fall or injury. Hx ivda, but denies any recent ivda, takes suboxone. Denies fever/chills. No hand numbness/weakness. No other area of pain, redness, or swelling.   The history is provided by the patient and medical records.       Home Medications Prior to Admission medications   Medication Sig Start Date End Date Taking? Authorizing Provider  amLODipine (NORVASC) 5 MG tablet Take 1 tablet (5 mg total) by mouth daily. Patient not taking: Reported on 08/04/2021 12/05/20   Linwood Dibbles, MD  Buprenorphine HCl-Naloxone HCl 2-0.5 MG FILM Use as directed per protocol for 7 days 09/21/21   Kern Alberta, MD  clindamycin (CLEOCIN) 300 MG capsule Take 1 capsule (300 mg total) by mouth 4 (four) times daily. X 7 days Patient not taking: No sig reported 08/04/21   Palumbo, April, MD      Allergies    Clonidine derivatives and Ivp dye [iodinated contrast media]    Review of Systems   Review of Systems  Constitutional:  Negative for chills, diaphoresis and fever.  HENT:  Negative for sore throat.   Respiratory:  Negative for shortness of breath.   Cardiovascular:  Negative for chest pain.  Gastrointestinal:  Negative for abdominal pain and vomiting.  Genitourinary:  Negative for dysuria and flank pain.  Musculoskeletal:  Negative for back pain, neck pain and neck stiffness.  Skin:  Negative for rash.  Neurological:  Negative for weakness, numbness and headaches.    Physical Exam Updated Vital Signs BP 123/77   Pulse 62   Temp 98.6 F (37 C) (Oral)   Resp  16   Ht 1.803 m (5\' 11" )   Wt 72.6 kg   SpO2 100%   BMI 22.32 kg/m  Physical Exam Vitals and nursing note reviewed.  Constitutional:      Appearance: Normal appearance. He is well-developed.  HENT:     Head: Atraumatic.     Nose: Nose normal.     Mouth/Throat:     Mouth: Mucous membranes are moist.  Eyes:     General: No scleral icterus.    Conjunctiva/sclera: Conjunctivae normal.  Neck:     Trachea: No tracheal deviation.  Cardiovascular:     Rate and Rhythm: Normal rate and regular rhythm.     Pulses: Normal pulses.     Heart sounds: Normal heart sounds. No murmur heard.    No friction rub. No gallop.  Pulmonary:     Effort: Pulmonary effort is normal. No accessory muscle usage or respiratory distress.     Breath sounds: Normal breath sounds.  Abdominal:     General: There is no distension.     Palpations: Abdomen is soft.     Tenderness: There is no abdominal tenderness.  Musculoskeletal:     Cervical back: Neck supple. No rigidity.     Comments: Erythema and increased warmth to mid/distal right forearm w indurated skin. No abscess or fluctuance noted. Radial pulse 2+. Moderate swelling. Compartments of forearm appear soft, not tense, not woody. No pain w passive  rom at elbow or wrist. No crepitus. No pain 'out of proportion' to exam findings.   Skin:    General: Skin is warm and dry.     Findings: No rash.     Comments: Skin intact, no bite marks noted.   Neurological:     Mental Status: He is alert.     Comments: Alert, speech clear. Right hand nvi, w intact motor/sens fxn.   Psychiatric:        Mood and Affect: Mood normal.     ED Results / Procedures / Treatments   Labs (all labs ordered are listed, but only abnormal results are displayed) Results for orders placed or performed during the hospital encounter of 04/16/23  CBC  Result Value Ref Range   WBC 14.2 (H) 4.0 - 10.5 K/uL   RBC 3.79 (L) 4.22 - 5.81 MIL/uL   Hemoglobin 10.8 (L) 13.0 - 17.0 g/dL    HCT 40.9 (L) 81.1 - 52.0 %   MCV 88.7 80.0 - 100.0 fL   MCH 28.5 26.0 - 34.0 pg   MCHC 32.1 30.0 - 36.0 g/dL   RDW 91.4 78.2 - 95.6 %   Platelets 284 150 - 400 K/uL   nRBC 0.0 0.0 - 0.2 %  Comprehensive metabolic panel  Result Value Ref Range   Sodium 135 135 - 145 mmol/L   Potassium 3.7 3.5 - 5.1 mmol/L   Chloride 101 98 - 111 mmol/L   CO2 27 22 - 32 mmol/L   Glucose, Bld 111 (H) 70 - 99 mg/dL   BUN 13 6 - 20 mg/dL   Creatinine, Ser 2.13 0.61 - 1.24 mg/dL   Calcium 8.6 (L) 8.9 - 10.3 mg/dL   Total Protein 7.2 6.5 - 8.1 g/dL   Albumin 3.4 (L) 3.5 - 5.0 g/dL   AST 19 15 - 41 U/L   ALT 18 0 - 44 U/L   Alkaline Phosphatase 107 38 - 126 U/L   Total Bilirubin 0.4 0.3 - 1.2 mg/dL   GFR, Estimated >08 >65 mL/min   Anion gap 7 5 - 15  Lactic acid, plasma  Result Value Ref Range   Lactic Acid, Venous 1.1 0.5 - 1.9 mmol/L   CT FOREARM RIGHT WO CONTRAST  Result Date: 04/16/2023 CLINICAL DATA:  Forearm pain, swelling, and erythema. EXAM: CT OF THE RIGHT FOREARM WITHOUT CONTRAST TECHNIQUE: Multidetector CT imaging was performed according to the standard protocol. Multiplanar CT image reconstructions were also generated. RADIATION DOSE REDUCTION: This exam was performed according to the departmental dose-optimization program which includes automated exposure control, adjustment of the mA and/or kV according to patient size and/or use of iterative reconstruction technique. COMPARISON:  04/16/2023 FINDINGS: Bones/Joint/Cartilage No fracture or acute bony finding. Ligaments Suboptimally assessed by CT. Muscles and Tendons No definite intramuscular abnormality, sensitivity mildly reduced due to required positioning next to the body adversely affecting signal to noise ratio. Soft tissues Subcutaneous edema extending from the elbow to the wrist in the forearm, with mild volar-radial sparing. No abnormal gas or compelling findings of abscess. No foreign body observed. The scout image demonstrates  prominence of stool in the colon raising the possibility of constipation. IMPRESSION: 1. Subcutaneous edema extending from the elbow to the wrist in the forearm, with mild volar-radial sparing. No abnormal gas or compelling findings of abscess. 2. Prominence of stool in the colon on the scout image raising the possibility of constipation. Electronically Signed   By: Gaylyn Rong M.D.   On: 04/16/2023 11:35  DG Forearm Right  Result Date: 04/16/2023 CLINICAL DATA:  Soft tissue swelling of the forearm. EXAM: RIGHT FOREARM - 2 VIEW COMPARISON:  None Available. FINDINGS: There is no evidence of fracture or other focal bone lesions. Diffuse soft tissue edema identified. IMPRESSION: 1. No acute bone abnormality. 2. Diffuse soft tissue edema. Electronically Signed   By: Signa Kell M.D.   On: 04/16/2023 08:53    EKG None  Radiology CT FOREARM RIGHT WO CONTRAST  Result Date: 04/16/2023 CLINICAL DATA:  Forearm pain, swelling, and erythema. EXAM: CT OF THE RIGHT FOREARM WITHOUT CONTRAST TECHNIQUE: Multidetector CT imaging was performed according to the standard protocol. Multiplanar CT image reconstructions were also generated. RADIATION DOSE REDUCTION: This exam was performed according to the departmental dose-optimization program which includes automated exposure control, adjustment of the mA and/or kV according to patient size and/or use of iterative reconstruction technique. COMPARISON:  04/16/2023 FINDINGS: Bones/Joint/Cartilage No fracture or acute bony finding. Ligaments Suboptimally assessed by CT. Muscles and Tendons No definite intramuscular abnormality, sensitivity mildly reduced due to required positioning next to the body adversely affecting signal to noise ratio. Soft tissues Subcutaneous edema extending from the elbow to the wrist in the forearm, with mild volar-radial sparing. No abnormal gas or compelling findings of abscess. No foreign body observed. The scout image demonstrates  prominence of stool in the colon raising the possibility of constipation. IMPRESSION: 1. Subcutaneous edema extending from the elbow to the wrist in the forearm, with mild volar-radial sparing. No abnormal gas or compelling findings of abscess. 2. Prominence of stool in the colon on the scout image raising the possibility of constipation. Electronically Signed   By: Gaylyn Rong M.D.   On: 04/16/2023 11:35   DG Forearm Right  Result Date: 04/16/2023 CLINICAL DATA:  Soft tissue swelling of the forearm. EXAM: RIGHT FOREARM - 2 VIEW COMPARISON:  None Available. FINDINGS: There is no evidence of fracture or other focal bone lesions. Diffuse soft tissue edema identified. IMPRESSION: 1. No acute bone abnormality. 2. Diffuse soft tissue edema. Electronically Signed   By: Signa Kell M.D.   On: 04/16/2023 08:53    Procedures Procedures    Medications Ordered in ED Medications  vancomycin (VANCOCIN) IVPB 1000 mg/200 mL premix (0 mg Intravenous Stopped 04/16/23 1143)  ceFEPIme (MAXIPIME) 2 g in sodium chloride 0.9 % 100 mL IVPB (0 g Intravenous Stopped 04/16/23 1027)  ibuprofen (ADVIL) tablet 400 mg (400 mg Oral Given 04/16/23 0825)  0.9 %  sodium chloride infusion ( Intravenous New Bag/Given 04/16/23 0935)  sodium chloride 0.9 % bolus 1,000 mL (0 mLs Intravenous Stopped 04/16/23 1143)    ED Course/ Medical Decision Making/ A&P                             Medical Decision Making Problems Addressed: Cellulitis of right arm: acute illness or injury with systemic symptoms that poses a threat to life or bodily functions History of intravenous drug use:    Details: History of, denies current/recent use Leukocytosis, unspecified type: acute illness or injury  Amount and/or Complexity of Data Reviewed External Data Reviewed: notes. Labs: ordered. Decision-making details documented in ED Course. Radiology: ordered and independent interpretation performed. Decision-making details documented in ED  Course. Discussion of management or test interpretation with external provider(s): Medicine/hospitalist  Risk Prescription drug management. Decision regarding hospitalization.   Iv ns. Continuous pulse ox and cardiac monitoring. Labs ordered/sent. Imaging ordered.   Differential diagnosis  includes cellulitis, necrotizing fasciitis, etc. Dispo decision including potential need for admission considered - will get labs and imaging and reassess.   Reviewed nursing notes and prior charts for additional history. External reports reviewed.  Cardiac monitor: sinus rhythm, rate 70. Labs and cultures sent.   Iv antibiotics given.   Labs reviewed/interpreted by me - wbc elevated. Lactate normal.   Xrays reviewed/interpreted by me - c/w cellulitis. No gas/aggressive st infection or abscess noted.   Given significant/extensive cellulitis, poor social situation, hx ivda, etc, recommend admission for iv antibiotics. Pt agreeable.   Hospitalists consulted for admission. Discussed w Dr Robb Matar - will admit.            Final Clinical Impression(s) / ED Diagnoses Final diagnoses:  Cellulitis of right arm  History of intravenous drug use  Leukocytosis, unspecified type    Rx / DC Orders ED Discharge Orders     None           Cathren Laine, MD 04/16/23 1207

## 2023-04-16 NOTE — ED Triage Notes (Addendum)
Pt reports he felt something crawl on his R shoulder last night while camping. When he woke up he had R forearm swelling. No obvious wound

## 2023-04-17 DIAGNOSIS — L03113 Cellulitis of right upper limb: Secondary | ICD-10-CM | POA: Diagnosis not present

## 2023-04-17 LAB — COMPREHENSIVE METABOLIC PANEL
ALT: 19 U/L (ref 0–44)
AST: 22 U/L (ref 15–41)
Albumin: 3 g/dL — ABNORMAL LOW (ref 3.5–5.0)
Alkaline Phosphatase: 107 U/L (ref 38–126)
Anion gap: 7 (ref 5–15)
BUN: 9 mg/dL (ref 6–20)
CO2: 23 mmol/L (ref 22–32)
Calcium: 8.3 mg/dL — ABNORMAL LOW (ref 8.9–10.3)
Chloride: 105 mmol/L (ref 98–111)
Creatinine, Ser: 0.59 mg/dL — ABNORMAL LOW (ref 0.61–1.24)
GFR, Estimated: >60 mL/min (ref >60)
Glucose, Bld: 91 mg/dL (ref 70–99)
Potassium: 3.5 mmol/L (ref 3.5–5.1)
Sodium: 135 mmol/L (ref 135–145)
Total Bilirubin: 1 mg/dL (ref 0.3–1.2)
Total Protein: 6.5 g/dL (ref 6.5–8.1)

## 2023-04-17 LAB — CBC
HCT: 36.4 % — ABNORMAL LOW (ref 39.0–52.0)
Hemoglobin: 11.6 g/dL — ABNORMAL LOW (ref 13.0–17.0)
MCH: 28.7 pg (ref 26.0–34.0)
MCHC: 31.9 g/dL (ref 30.0–36.0)
MCV: 90.1 fL (ref 80.0–100.0)
Platelets: 276 10*3/uL (ref 150–400)
RBC: 4.04 MIL/uL — ABNORMAL LOW (ref 4.22–5.81)
RDW: 13 % (ref 11.5–15.5)
WBC: 12.5 10*3/uL — ABNORMAL HIGH (ref 4.0–10.5)
nRBC: 0 % (ref 0.0–0.2)

## 2023-04-17 LAB — RAPID URINE DRUG SCREEN, HOSP PERFORMED
Amphetamines: POSITIVE — AB
Barbiturates: NOT DETECTED
Benzodiazepines: NOT DETECTED
Cocaine: NOT DETECTED
Opiates: POSITIVE — AB
Tetrahydrocannabinol: POSITIVE — AB

## 2023-04-17 LAB — HIV ANTIBODY (ROUTINE TESTING W REFLEX): HIV Screen 4th Generation wRfx: NONREACTIVE

## 2023-04-17 MED ORDER — DOXYCYCLINE MONOHYDRATE 100 MG PO TABS: 100.0000 mg | ORAL_TABLET | Freq: Two times a day (BID) | ORAL | 0 refills | Status: AC

## 2023-04-17 MED ORDER — METHADONE HCL 10 MG PO TABS: 70.0000 mg | ORAL_TABLET | Freq: Every day | ORAL | Status: DC

## 2023-04-17 NOTE — Progress Notes (Signed)
Pt refused his suboxone and requesting for methadone because he said he injected heroine yesterday. Elvera Lennox, MD made aware and seen the pt. MD asked what his dose and pt answered 70mg , order was placed. Called Pharmacy to verify the order and pt said he was not receiving Methadone in the outpatient clinic and not taking it. Pt says if he will not get Methadone, he will leave AMA. MD made aware and Doxycycline prescription script given to the pt.

## 2023-04-17 NOTE — Progress Notes (Signed)
PROGRESS NOTE  Caleb Bush ZDG:[id number] DOB: [birth year] DOA: 04/16/2023 PCP: System, Provider Not In   LOS: 1 day   Brief Narrative / Interim history: 42 year old male with history of GERD, hep C, substance abuse comes into the hospital with right forearm swelling.  Reported some bug bites on the right side.  He had an elevated white count in the ED, there was concern for cellulitis, he was placed on antibiotics and admitted to the hospital.  CT forearm on the right showed subcutaneous edema extending from the elbow to the wrist, with mild volar radial sparing, no abnormal gas or abscesses findings.  Subjective / 24h Interval events: He is a bit sleepy this morning, complains of right arm pain.  Cannot really appreciate a lot of improvement  Assesement and Plan: Principal Problem:   Cellulitis of right arm Active Problems:   Heroin abuse (HCC)   Hepatitis, chronic (HCC)   Normocytic anemia   Hypoalbuminemia   Elevated blood pressure, situational   Tobacco abuse   Principal problem Right forearm cellulitis - still swollen today, continue antibiotics with vancomycin/Rocephin.  White count appears to be improving  Active: Heroin use-last used the day prior to admission.  Per patient, did not inject the right arm.  Resume Suboxone  Chronic hep C-LFTs look normal.  Outpatient follow-up  Scheduled Meds:  buprenorphine-naloxone  1 tablet Sublingual Daily   Continuous Infusions:  sodium chloride 75 mL/hr at 04/16/23 2339   cefTRIAXone (ROCEPHIN)  IV Stopped (04/16/23 1659)   vancomycin 1,250 mg (04/17/23 0607)   PRN Meds:.acetaminophen **OR** acetaminophen, ketorolac, ondansetron **OR** ondansetron (ZOFRAN) IV, traMADol  Current Outpatient Medications  Medication Instructions   amLODipine (NORVASC) 5 mg, Oral, Daily   ibuprofen (ADVIL) 200-800 mg, Oral, Every 6 hours PRN   naloxone (NARCAN) 0.4 mg, Nasal, Once PRN   SUBOXONE 8-2 MG FILM 1 Film, Sublingual, 3 times daily     Diet Orders (From admission, onward)     Start     Ordered   04/16/23 1347  Diet Heart Room service appropriate? Yes; Fluid consistency: Thin  Diet effective now       Question Answer Comment  Room service appropriate? Yes   Fluid consistency: Thin      04/16/23 1347            DVT prophylaxis: SCDs Start: 04/16/23 1346   Lab Results  Component Value Date   PLT 276 04/17/2023      Code Status: Full Code  Family Communication: no family at bedside   Status is: Inpatient  Remains inpatient appropriate because: severity of illness, IV antibiotics  Level of care: Med-Surg  Consultants:  none  Objective: Vitals:   04/16/23 1350 04/16/23 2000 04/16/23 2354 04/17/23 0434  BP: 134/87 (!) 173/91 126/66 (!) 140/84  Pulse: 68 73 71 65  Resp:  20 18 18   Temp:  98.4 F (36.9 C) 98.2 F (36.8 C) 98.4 F (36.9 C)  TempSrc:  Oral    SpO2: 100% 100% 100% 100%  Weight: 66.5 kg     Height: 5\' 11"  (1.803 m)       Intake/Output Summary (Last 24 hours) at 04/17/2023 1038 Last data filed at 04/17/2023 0800 Gross per 24 hour  Intake 2390.76 ml  Output --  Net 2390.76 ml   Wt Readings from Last 3 Encounters:  04/16/23 66.5 kg  08/05/21 75.3 kg  12/05/20 76.3 kg    Examination:  Constitutional: NAD Eyes: no scleral icterus ENMT: Mucous membranes  are moist.  Neck: normal, supple Respiratory: clear to auscultation bilaterally, no wheezing, no crackles. Normal respiratory effort. No accessory muscle use.  Cardiovascular: Regular rate and rhythm, no murmurs / rubs / gallops. No LE edema.  Abdomen: non distended, no tenderness. Bowel sounds positive.  Musculoskeletal: no clubbing / cyanosis.    Data Reviewed: I have independently reviewed following labs and imaging studies   CBC Recent Labs  Lab 04/16/23 0922 04/17/23 0430  WBC 14.2* 12.5*  HGB 10.8* 11.6*  HCT 33.6* 36.4*  PLT 284 276  MCV 88.7 90.1  MCH 28.5 28.7  MCHC 32.1 31.9  RDW 13.2 13.0     Recent Labs  Lab 04/16/23 0922 04/16/23 1205 04/17/23 0430  NA 135  --  135  K 3.7  --  3.5  CL 101  --  105  CO2 27  --  23  GLUCOSE 111*  --  91  BUN 13  --  9  CREATININE 0.61  --  0.59*  CALCIUM 8.6*  --  8.3*  AST 19  --  22  ALT 18  --  19  ALKPHOS 107  --  107  BILITOT 0.4  --  1.0  ALBUMIN 3.4*  --  3.0*  MG  --  1.9  --   LATICACIDVEN 1.1  --   --     ------------------------------------------------------------------------------------------------------------------ No results for input(s): "CHOL", "HDL", "LDLCALC", "TRIG", "CHOLHDL", "LDLDIRECT" in the last 72 hours.  No results found for: "HGBA1C" ------------------------------------------------------------------------------------------------------------------ No results for input(s): "TSH", "T4TOTAL", "T3FREE", "THYROIDAB" in the last 72 hours.  Invalid input(s): "FREET3"  Cardiac Enzymes No results for input(s): "CKMB", "TROPONINI", "MYOGLOBIN" in the last 168 hours.  Invalid input(s): "CK" ------------------------------------------------------------------------------------------------------------------ No results found for: "BNP"  CBG: No results for input(s): "GLUCAP" in the last 168 hours.  No results found for this or any previous visit (from the past 240 hour(s)).   Radiology Studies: CT FOREARM RIGHT WO CONTRAST  Result Date: 04/16/2023 CLINICAL DATA:  Forearm pain, swelling, and erythema. EXAM: CT OF THE RIGHT FOREARM WITHOUT CONTRAST TECHNIQUE: Multidetector CT imaging was performed according to the standard protocol. Multiplanar CT image reconstructions were also generated. RADIATION DOSE REDUCTION: This exam was performed according to the departmental dose-optimization program which includes automated exposure control, adjustment of the mA and/or kV according to patient size and/or use of iterative reconstruction technique. COMPARISON:  04/16/2023 FINDINGS: Bones/Joint/Cartilage No fracture or  acute bony finding. Ligaments Suboptimally assessed by CT. Muscles and Tendons No definite intramuscular abnormality, sensitivity mildly reduced due to required positioning next to the body adversely affecting signal to noise ratio. Soft tissues Subcutaneous edema extending from the elbow to the wrist in the forearm, with mild volar-radial sparing. No abnormal gas or compelling findings of abscess. No foreign body observed. The scout image demonstrates prominence of stool in the colon raising the possibility of constipation. IMPRESSION: 1. Subcutaneous edema extending from the elbow to the wrist in the forearm, with mild volar-radial sparing. No abnormal gas or compelling findings of abscess. 2. Prominence of stool in the colon on the scout image raising the possibility of constipation. Electronically Signed   By: Gaylyn Rong M.D.   On: 04/16/2023 11:35     Pamella Pert, MD, PhD [facility]  Between 7 am - 7 pm I am available, please contact me via Amion (for emergencies) or Securechat (non urgent messages)  Between 7 pm - 7 am I am not available, please contact night coverage MD/APP via Amion

## 2023-04-17 NOTE — Discharge Summary (Signed)
Physician [facility] Discharge Summary  Caleb Bush NWG:[id number] DOB: 1981-09-06 DOA: 04/16/2023  PCP: System, Provider Not In  Admit date: 04/16/2023 Discharge date: 04/17/2023  Admitted From: home Disposition:  left AMA  Recommendations for Outpatient Follow-up:  Follow up with PCP in 1-2 week  Home Health: none Equipment/Devices: none  Discharge Condition: guarded CODE STATUS: Full   Hospital Course / Discharge diagnoses: Principal Problem:   Cellulitis of right arm Active Problems:   Heroin abuse (HCC)   Hepatitis, chronic (HCC)   Normocytic anemia   Hypoalbuminemia   Elevated blood pressure, situational   Tobacco abuse   For full details, please see today's progress note.  In brief, this is a 42 year old male with history of IV drug use and heroin use, admitted on 7/17 with right forearm swelling, reporting some bug bites on the right arm.  Denies any IV drug use on that side.  He is almost swollen and he was placed on antibiotics and admitted to the hospital.  He was placed on Suboxone, however patient has been refusing this and asking for methadone, 70 mg daily.  He was vague on how he obtained that medication, and pharmacy could not verify the dose, and given that could not be safely administered methadone. Patient at this time expresses desire to leave the Hospital immidiately, patient has been warned that this is not Medically advisable at this time, and can result in Medical complications like Death and Disability, patient understands and accepts the risks involved and assumes full responsibilty of this decision.   Discharge Instructions   Allergies as of 04/17/2023       Reactions   Clonidine Derivatives Other (See Comments)   Allergic reaction not confirmed   Ivp Dye [iodinated Contrast Media] Other (See Comments)   Allergic reaction not confirmed     Med Rec must be completed prior to using this [facility]       Follow-up Information     primary care  physician. Schedule an appointment as soon as possible for a visit.                  Consultations: none  Procedures/Studies:  CT FOREARM RIGHT WO CONTRAST  Result Date: 04/16/2023 CLINICAL DATA:  Forearm pain, swelling, and erythema. EXAM: CT OF THE RIGHT FOREARM WITHOUT CONTRAST TECHNIQUE: Multidetector CT imaging was performed according to the standard protocol. Multiplanar CT image reconstructions were also generated. RADIATION DOSE REDUCTION: This exam was performed according to the departmental dose-optimization program which includes automated exposure control, adjustment of the mA and/or kV according to patient size and/or use of iterative reconstruction technique. COMPARISON:  04/16/2023 FINDINGS: Bones/Joint/Cartilage No fracture or acute bony finding. Ligaments Suboptimally assessed by CT. Muscles and Tendons No definite intramuscular abnormality, sensitivity mildly reduced due to required positioning next to the body adversely affecting signal to noise ratio. Soft tissues Subcutaneous edema extending from the elbow to the wrist in the forearm, with mild volar-radial sparing. No abnormal gas or compelling findings of abscess. No foreign body observed. The scout image demonstrates prominence of stool in the colon raising the possibility of constipation. IMPRESSION: 1. Subcutaneous edema extending from the elbow to the wrist in the forearm, with mild volar-radial sparing. No abnormal gas or compelling findings of abscess. 2. Prominence of stool in the colon on the scout image raising the possibility of constipation. Electronically Signed   By: Gaylyn Rong M.D.   On: 04/16/2023 11:35   DG Forearm Right  Result Date: 04/16/2023  CLINICAL DATA:  Soft tissue swelling of the forearm. EXAM: RIGHT FOREARM - 2 VIEW COMPARISON:  None Available. FINDINGS: There is no evidence of fracture or other focal bone lesions. Diffuse soft tissue edema identified. IMPRESSION: 1. No acute bone  abnormality. 2. Diffuse soft tissue edema. Electronically Signed   By: Signa Kell M.D.   On: 04/16/2023 08:53     Subjective: - no chest pain, shortness of breath, no abdominal pain, nausea or vomiting.   Discharge Exam: BP (!) 140/84 (BP Location: Left Arm)   Pulse 65   Temp 98.4 F (36.9 C)   Resp 18   Ht 5\' 11"  (1.803 m)   Wt 66.5 kg   SpO2 100%   BMI 20.45 kg/m   General: Pt is alert, awake, not in acute distress Cardiovascular: RRR, S1/S2 +, no rubs, no gallops Respiratory: CTA bilaterally, no wheezing, no rhonchi Abdominal: Soft, NT, ND, bowel sounds + Extremities: no edema, no cyanosis    The results of significant diagnostics from this hospitalization (including imaging, microbiology, ancillary and laboratory) are listed below for reference.     Microbiology: No results found for this or any previous visit (from the past 240 hour(s)).   Labs: Basic Metabolic Panel: Recent Labs  Lab 04/16/23 0922 04/16/23 1205 04/17/23 0430  NA 135  --  135  K 3.7  --  3.5  CL 101  --  105  CO2 27  --  23  GLUCOSE 111*  --  91  BUN 13  --  9  CREATININE 0.61  --  0.59*  CALCIUM 8.6*  --  8.3*  MG  --  1.9  --    Liver Function Tests: Recent Labs  Lab 04/16/23 0922 04/17/23 0430  AST 19 22  ALT 18 19  ALKPHOS 107 107  BILITOT 0.4 1.0  PROT 7.2 6.5  ALBUMIN 3.4* 3.0*   CBC: Recent Labs  Lab 04/16/23 0922 04/17/23 0430  WBC 14.2* 12.5*  HGB 10.8* 11.6*  HCT 33.6* 36.4*  MCV 88.7 90.1  PLT 284 276   CBG: No results for input(s): "GLUCAP" in the last 168 hours. Hgb A1c No results for input(s): "HGBA1C" in the last 72 hours. Lipid Profile No results for input(s): "CHOL", "HDL", "LDLCALC", "TRIG", "CHOLHDL", "LDLDIRECT" in the last 72 hours. Thyroid function studies No results for input(s): "TSH", "T4TOTAL", "T3FREE", "THYROIDAB" in the last 72 hours.  Invalid input(s): "FREET3" Urinalysis    Component Value Date/Time   COLORURINE COLORLESS (A)  12/15/2008 2016   APPEARANCEUR CLEAR 12/15/2008 2016   LABSPEC 1.005 12/15/2008 2016   PHURINE 6.5 12/15/2008 2016   GLUCOSEU NEGATIVE 12/15/2008 2016   HGBUR NEGATIVE 12/15/2008 2016   BILIRUBINUR NEGATIVE 12/15/2008 2016   KETONESUR NEGATIVE 12/15/2008 2016   PROTEINUR NEGATIVE 12/15/2008 2016   UROBILINOGEN 0.2 12/15/2008 2016   NITRITE NEGATIVE 12/15/2008 2016   LEUKOCYTESUR  12/15/2008 2016    NEGATIVE MICROSCOPIC NOT DONE ON URINES WITH NEGATIVE PROTEIN, BLOOD, LEUKOCYTES, NITRITE, OR GLUCOSE <1000 mg/dL.    FURTHER DISCHARGE INSTRUCTIONS:   Get Medicines reviewed and adjusted: Please take all your medications with you for your next visit with your Primary MD   Laboratory/radiological data: Please request your Primary MD to go over all hospital tests and procedure/radiological results at the follow up, please ask your Primary MD to get all Hospital records sent to his/her office.   In some cases, they will be blood work, cultures and biopsy results pending at the time of  your discharge. Please request that your primary care M.D. goes through all the records of your hospital data and follows up on these results.   Also Note the following: If you experience worsening of your admission symptoms, develop shortness of breath, life threatening emergency, suicidal or homicidal thoughts you must seek medical attention immediately by calling 911 or calling your MD immediately  if symptoms less severe.   You must read complete instructions/literature along with all the possible adverse reactions/side effects for all the Medicines you take and that have been prescribed to you. Take any new Medicines after you have completely understood and accpet all the possible adverse reactions/side effects.    Do not drive when taking Pain medications or sleeping medications (Benzodaizepines)   Do not take more than prescribed Pain, Sleep and Anxiety Medications. It is not advisable to combine  anxiety,sleep and pain medications without talking with your primary care practitioner   Special Instructions: If you have smoked or chewed Tobacco  in the last 2 yrs please stop smoking, stop any regular Alcohol  and or any Recreational drug use.   Wear Seat belts while driving.   Please note: You were cared for by a hospitalist during your hospital stay. Once you are discharged, your primary care physician will handle any further medical issues. Please note that NO REFILLS for any discharge medications will be authorized once you are discharged, as it is imperative that you return to your primary care physician (or establish a relationship with a primary care physician if you do not have one) for your post hospital discharge needs so that they can reassess your need for medications and monitor your lab values.  Time coordinating discharge: 10 minutes  SIGNED:  Pamella Pert, MD, PhD 04/17/2023, 12:35 PM

## 2023-04-18 LAB — CULTURE, BLOOD (ROUTINE X 2)

## 2023-04-19 LAB — CULTURE, BLOOD (ROUTINE X 2): Culture: NO GROWTH

## 2023-04-21 LAB — CULTURE, BLOOD (ROUTINE X 2): Specimen Description: ADEQUATE

## 2024-06-17 NOTE — H&P (Signed)
 ------------------------------------------------------------------------------- Attestation signed by Stanly Oris, MD at 06/19/2024  9:26 AM .Discussed with  APP, agree with the assessment and plan of care as documented below   -------------------------------------------------------------------------------   HOSPITAL MEDICINE - ADMISSION NOTE  Primary Care Physician PCP Needed PPI  Primary Contact Extended Emergency Contact Information Primary Emergency Contact: Hall,Claudia Mobile Phone: [phone] Relation: Sister   Chief Complaint Left arm and hand swelling   History of Present Illness   Caleb Bush is a 43 y.o. male with history of IV drug use (heroin) and recurrent cellulitis of the arm presenting to [facility] ED for c/o left arm and hand swelling that began 06/15/2024. Patient states he was working with metal shavings the day before the swelling occurred. He denies injury to site , he denies recent IV drug use in that arm. Patient reports the pain is intense, currently on methadone  100mg ? Daily , but missed his appointment today due to coming to the ED. Patient is reporting pain in his fingers and decreased ROM. Unsure if up to date on tetanus vaccine.   He denies fever, chills, night sweats, NVD, SHOB, CP    Review of Systems 10 point review of negative unless noted in HPI   PMFSH and Home Medications   Past Medical History  Medical History[1]  Surgical History Surgical History[2]   Family History Family History[3]  Social History Social History[4]   Allergies Penicillins, Iodinated contrast media, and Ioxaglate sodium  Medications Prescriptions Prior to Admission[5]   Objective   Temp:  [97.5 F (36.4 C)] 97.5 F (36.4 C) Heart Rate:  [78-81] 78 Resp:  [12-20] 12 BP: (132-140)/(93-95) 132/93    Physical Exam Vitals reviewed.  Constitutional:      General: He is in acute distress.  HENT:     Mouth/Throat:     Mouth: Mucous membranes are moist.   Cardiovascular:     Rate and Rhythm: Normal rate and regular rhythm.     Pulses: Normal pulses.          Radial pulses are 2+ on the right side and 2+ on the left side.       Dorsalis pedis pulses are 2+ on the right side and 2+ on the left side.       Posterior tibial pulses are 2+ on the right side and 2+ on the left side.     Heart sounds: Normal heart sounds.     Comments: Left upper extremity edema  Pulmonary:     Effort: Pulmonary effort is normal.     Breath sounds: Normal breath sounds.  Abdominal:     General: Abdomen is flat. Bowel sounds are normal. There is no distension.     Palpations: Abdomen is soft.  Musculoskeletal:        General: Deformity present.     Cervical back: Normal range of motion.     Right ankle: Deformity present.     Comments: Right ankle deformity patient states unrepaired ankle fx   Skin:    Findings: Erythema and lesion present.     Comments: Erythematous swollen  left forearm arm with small lesions note on the posterior aspect of forearm   Neurological:     General: No focal deficit present.     Mental Status: He is alert and oriented to person, place, and time. Mental status is at baseline.  Psychiatric:        Mood and Affect: Mood is anxious.        Speech: Speech  normal.     Comments: Patient having anxiety secondary to pain response from left forearm cellulitis      Results from last 7 days  Lab Units 06/17/24 0629  WHITE BLOOD CELL COUNT 10*3/uL 16.16*  HEMOGLOBIN g/dL 87.5*  HEMATOCRIT % 62.7*  PLATELET COUNT 10*3/uL 248    Results from last 7 days  Lab Units 06/17/24 0629  SODIUM mmol/L 136  POTASSIUM mmol/L 3.6  CHLORIDE mmol/L 104  CO2 mmol/L 25  BUN mg/dL 15  CREATININE mg/dL 9.48*  GLUCOSE mg/dL 896*  CALCIUM mg/dL 9.0        No results found for this visit on 06/17/24 (from the past week).   CT Radius Ulna Left WO Contrast Result Date: 06/17/2024 Diffuse circumferential subcutaneous edema and skin thickening  throughout the visualized upper extremity. Correlate for evidence of cellulitis. No focal drainable fluid collection. No evidence of acute osteomyelitis or necrotizing fasciitis.   XR Elbow 2 Views Left Result Date: 06/17/2024 1.  No erosions to suggest acute osteomyelitis. 2.  Soft tissue swelling about the elbow, circumferentially involving the visualized proximal forearm and distal arm. Correlate for evidence of cellulitis. 3.  No subcutaneous gas to suggest necrotizing fasciitis. 4.  No acute fracture or malalignment. 5.  No joint effusion. 6.  Joint spaces are maintained.    Assessment and Plan   Assessment & Plan Cellulitis of arm, left --reports starting 06/15/24 --blood cultures have been drawn  --Start IV cefepime  and vancomycin   --IV morphine  and oxycodone  prn pain  --Orthopedics has been consulted for possible septic arthritis  --Tetanus  booster  --UDS  Opioid use disorder --patient takes methadone  outpatient  --hold for now while on IV opioids   DVT Prophylaxis: Heparin SubQ  CODE STATUS: Full Code. Information obtained from patient  Could Caleb T Rivest be considered for Hospital at Home in the next 24 hours? No   Anticipate > 2 midnight stay, patient will be admitted as inpatient for Cellulitis of arm, leftEstimated date of discharge Jun 20, 2024    Jhonny Pali, ACNP           [1] Past Medical History: Diagnosis Date   Hypertension    Opioid abuse (CMD)    Substance abuse (CMD)   [2] Past Surgical History: Procedure Laterality Date   BONE INCISION AND DRAINAGE Left 04/05/2018   Procedure: INCISION AND DRAINAGE BONE LEFT FOOT;  Surgeon: Ozell Dasie Molt, DPM;  Location: [facility] MAIN OR;  Service: Podiatry;  Laterality: Left;  Last case   DEBRIDEMENT  FOOT Left 01/04/2018   Procedure: IRRIGATION & DEBRIDEMENT FOOT / ANKLE;  Surgeon: Glendia Ambrose Blush, MD;  Location: [facility] MAIN OR;  Service: Orthopedics;  Laterality: Left;  e12   DEBRIDEMENT  FOOT  Left 01/06/2018   Procedure: IRRIGATION & DEBRIDEMENT FOOT / ANKLE and Vac Change;  Surgeon: Lamar JONETTA Grain, MD;  Location: [facility] MAIN OR;  Service: Orthopedics;  Laterality: Left;   DEBRIDEMENT  FOOT Left 01/08/2018   Procedure: IRRIGATION & DEBRIDEMENT FOOT / ANKLE;  Surgeon: Fairy Prentice Miyamoto, MD PhD;  Location: [facility] MAIN OR;  Service: Plastics;  Laterality: Left;  Please post for 2 hours   FLAP TAKEDOWN Left 01/08/2018   Procedure: FLAP CLOSURE LEFT FOOT WOUND  /Rotation flap;  Surgeon: Fairy Prentice Miyamoto, MD PhD;  Location: [facility] MA[facility] OR;  Service: Plastics;  Laterality: Left;   SKIN GRAFT Left 01/08/2018   Procedure: SPLIT THICKNESS SKIN GRAFT FOOT / ANKLE;  Surgeon: Fairy Prentice Miyamoto, MD  PhD;  Location: [facility] MAIN OR;  Service: Government Social Research Officer;  Laterality: Left;  [3] Family History Problem Relation Name Age of Onset   Clotting disorder Neg Hx     Anesthesia problems Neg Hx    [4] Social History Socioeconomic History   Marital status: Single  Tobacco Use   Smoking status: Every Day    Current packs/day: 0.25    Types: Cigarettes   Smokeless tobacco: Never  Substance and Sexual Activity   Alcohol use: Not Currently   Drug use: Yes    Types: Marijuana, Opium , Heroin, IV, Benzodiazepines   Social Drivers of Health   Food Insecurity: No Food Insecurity (04/16/2023)   Received [facility]   Food vital sign    Within the past 12 months, you worried that your food would run out before you got money to buy more: Never true    Within the past 12 months, the food you bought just didn't last and you didn't have money to get more: Never true  Transportation Needs: Unmet Transportation Needs (04/16/2023)   Received from [facility] - Transportation    Lack of Transportation (Medical): Yes    Lack of Transportation (Non-Medical): No  Safety: Not At Risk (04/16/2023)   Received from [facility]   Safety    Within the last year, have you been afraid of your partner or  ex-partner?: No    Within the last year, have you been humiliated or emotionally abused in other ways by your partner or ex-partner?: No    Within the last year, have you been kicked, hit, slapped, or otherwise physically hurt by your partner or ex-partner?: No    Within the last year, have you been raped or forced to have any kind of sexual activity by your partner or ex-partner?: No  [5] (Not in a hospital admission)

## 2024-06-18 NOTE — Progress Notes (Signed)
 [facility] ORTHOPEDICS  Fri 06/18/2024  Orthopedic Established Patient Note  Patient ID:  Caleb Bush is a 43 y.o. male     Assessment:  Left forearm cellulitis with no abscess or joint involvement per CT.  Still has significant edema.  Lying in a dependent position.    Plan:  Discussed importance of keeping the hand above the elbow and the elbow above the heart on multiple pillows is much as possible.  Encouraged range of motion exercises in the fingers.  Ordered edema glove through occupational therapy.  Appreciate any input as to other edema measures.  May need to set up foam traction device to suspend the left upper extremity if the patient continues noncompliance with elevation.    Chief Complaint:   Left arm swelling    History of Present Illness:  Patient is somnolent but denies problems overnight.  Found with a needle in his bed per staff.  States he still has moderately severe aching pain.  Denies numbness or tingling.  Vital Signs  BP 154/78 (BP Location: Right arm, Patient Position: Lying)   Pulse 89   Temp 98 F (36.7 C) (Oral)   Resp 18   Ht 1.803 m (5' 11)   Wt 65.2 kg (143 lb 11.8 oz)   SpO2 99%   BMI 20.05 kg/m  Body mass index is 20.05 kg/m.  Physical Exam  Today he is somnolent but arousable.  He is lying on his right side with the left hand hanging below the level of the bed.  Right upper extremity has good range of motion without pain tenderness weakness or instability.  The left shoulder is nontender.  Left elbow and forearm swelling are basically unchanged.  He has mild erythema mostly localized along the ulnar border of the forearm which is where he has the most focal tenderness.  Surprisingly little tenderness and no pain on range of motion of the wrist.  Fingers are still very swollen with 6 cm tip to palm distance composite fist diffusely.  Radiology  CT Radius Ulna Left WO Contrast Result Date: 06/17/2024 CT FOREARM (RADIUS/ULNA) LEFT WITHOUT  CONTRAST, 06/17/2024 6:56 AM INDICATION: Soft tissue infection suspected, forearm, xray done COMPARISON: Same-day left elbow radiographs. TECHNIQUE: Thin-section images of the left forearm (radius/ulna) were obtained without intravenous contrast. Supplemental 2D reformatted images were generated and reviewed as needed. All CT scans at [facility] and [facility] are performed using radiation dose optimization techniques as appropriate to a performed exam, including but not limited to one or more of the following: automatic exposure control, adjustment of the mA and/or kV according to patient size, use of iterative reconstruction technique. In addition, our institution participates in a radiation dose monitoring program to optimize patient radiation exposure. FINDINGS: . Circumferential subcuticular reticulation and edema with overlying skin thickening throughout the entire visualized left upper extremity, most pronounced along the ulnar aspect of the arm and forearm and along the dorsal hand. . There is a small superficial ulceration along the ulnar aspect of the mid to distal forearm. . No focal drainable fluid collection. No intramuscular collection. . No evidence of acute osteomyelitis. No subcutaneous gas suggest necrotizing fasciitis. . No acute fracture. . No malalignment. . Joint spaces are maintained. Additional: Large colonic stool burden. Similar severe left hip degenerative changes with superior dislocation of the left femur, grossly similar to prior. Moderate right hip and pubic symphyseal joint degenerative changes. Mild aortoiliac atherosclerosis.   Diffuse circumferential subcutaneous edema  and skin thickening throughout the visualized upper extremity. Correlate for evidence of cellulitis. No focal drainable fluid collection. No evidence of acute osteomyelitis or necrotizing fasciitis.   XR Elbow 2 Views Left Result Date: 06/17/2024 X-RAY ELBOW LEFT (2 VIEWS),  06/17/2024 6:16 AM INDICATION: Injury. COMPARISON: None.   1.  No erosions to suggest acute osteomyelitis. 2.  Soft tissue swelling about the elbow, circumferentially involving the visualized proximal forearm and distal arm. Correlate for evidence of cellulitis. 3.  No subcutaneous gas to suggest necrotizing fasciitis. 4.  No acute fracture or malalignment. 5.  No joint effusion. 6.  Joint spaces are maintained.     Allergies  Penicillins, Iodinated contrast media, and Ioxaglate sodium   Medications    Current Medications[1]   Past Medical History  Medical History[2]   Past Surgical History  Surgical History[3]   Family History  Family History[4]   Social History:  Social History[5]   Review of Systems  Review of Systems       All other systems are reviewed and are negative except as per HPI.  Problem List  Problem List[6]     Vinie KYM Fonder, MD         Note - This record has been created using Dragon software. Chart creation errors have been sought, but may not always have been located. Such creation errors do not reflect on the standard of medical care.         [1] Current Facility-Administered Medications  Medication Dose Route Frequency Provider Last Rate Last Admin   cefepime  (MAXIPIME ) 2 g in sodium chloride  0.9 % 100 mL IVPB  2 g intravenous Q8H Jhonny Pali, ACNP 25 mL/hr at 06/18/24 0829 2 g at 06/18/24 9170   dextrose  (D50W) 50 % injection 12.5 g  12.5 g intravenous PRN Jhonny Pali, ACNP       dextrose  (GLUTOSE) 40 % oral gel 15 g  15 g oral PRN Ladonna Josey, ACNP       heparin (porcine) 5,000 unit/mL injection 5,000 Units  5,000 Units subcutaneous TID Ladonna Josey, ACNP   5,000 Units at 06/18/24 0504   ketorolac  (TORADOL ) injection 15 mg  15 mg intravenous Q6H PRN Jhonny Pali, ACNP   15 mg at 06/18/24 0653   metoclopramide (REGLAN) injection 5 mg  5 mg intravenous Q6H PRN Jhonny Pali, ACNP       metroNIDAZOLE   (FLAGYL ) tablet 500 mg  500 mg oral TID Ladonna Josey, ACNP   500 mg at 06/18/24 0504   morphine  injection 4 mg  4 mg intravenous Q3H PRN Ladonna Josey, ACNP   4 mg at 06/18/24 9170   oxyCODONE  (ROXICODONE ) immediate release tablet 10 mg  10 mg oral Q4H PRN Jhonny Pali, ACNP   10 mg at 06/18/24 0105   vancomycin  (VANCOCIN ) 1,000 mg in sodium chloride  0.9 % 250 mL IVPB  15 mg/kg (Adjusted) intravenous Q12H Jhonny Pali, ACNP   Stopped at 06/18/24 0604   Vancomycin  Pharmacy to Manage   miscellaneous Pharmacy to Manage Jhonny Pali, ACNP      [2] Past Medical History: Diagnosis Date   Hypertension    Opioid abuse (CMD)    Substance abuse (CMD)   [3] Past Surgical History: Procedure Laterality Date   BONE INCISION AND DRAINAGE Left 04/05/2018   Procedure: INCISION AND DRAINAGE BONE LEFT FOOT;  Surgeon: Ozell Dasie Molt, DPM;  Location: [facility] MAIN OR;  Service: Podiatry;  Laterality: Left;  Last case   DEBRIDEMENT  FOOT Left 01/04/2018  Procedure: IRRIGATION & DEBRIDEMENT FOOT / ANKLE;  Surgeon: Glendia Ambrose Blush, MD;  Location: [facility] MAIN OR;  Service: Orthopedics;  Laterality: Left;  e12   DEBRIDEMENT  FOOT Left 01/06/2018   Procedure: IRRIGATION & DEBRIDEMENT FOOT / ANKLE and Vac Change;  Surgeon: Lamar JONETTA Grain, MD;  Location: [facility] MAIN OR;  Service: Orthopedics;  Laterality: Left;   DEBRIDEMENT  FOOT Left 01/08/2018   Procedure: IRRIGATION & DEBRIDEMENT FOOT / ANKLE;  Surgeon: Fairy Prentice Miyamoto, MD PhD;  Location: [facility] MAIN OR;  Service: Plastics;  Laterality: Left;  Please post for 2 hours   FLAP TAKEDOWN Left 01/08/2018   Procedure: FLAP CLOSURE LEFT FOOT WOUND  /Rotation flap;  Surgeon: Fairy Prentice Miyamoto, MD PhD;  Location: [facility] MAIN OR;  Service: Plastics;  Laterality: Left;   SKIN GRAFT Left 01/08/2018   Procedure: SPLIT THICKNESS SKIN GRAFT FOOT / ANKLE;  Surgeon: Fairy Prentice Miyamoto, MD PhD;  Location: [facility] MAIN OR;  Service: Plastics;  Laterality: Left;  [4] Family  History Problem Relation Name Age of Onset   Clotting disorder Neg Hx     Anesthesia problems Neg Hx    [5] Social History Socioeconomic History   Marital status: Single  Tobacco Use   Smoking status: Every Day    Current packs/day: 0.25    Types: Cigarettes   Smokeless tobacco: Never  Substance and Sexual Activity   Alcohol use: Not Currently   Drug use: Yes    Types: Marijuana, Opium , Heroin, IV, Benzodiazepines   Social Drivers of Health   Food Insecurity: Unknown (06/17/2024)   Food vital sign    Within the past 12 months, you worried that your food would run out before you got money to buy more: Patient unable to answer    Within the past 12 months, the food you bought just didn't last and you didn't have money to get more: Patient unable to answer  Transportation Needs: Unknown (06/17/2024)   Transportation    In the past 12 months, has lack of reliable transportation kept you from medical appointments, meetings, work or from getting things needed for daily living? : Patient unable to answer  Safety: Unknown (06/17/2024)   Safety    How often does anyone, including family and friends, physically hurt you?: Patient unable to answer    How often does anyone, including family and friends, insult or talk down to you?: Patient unable to answer    How often does anyone, including family and friends, threaten you with harm?: Patient unable to answer    How often does anyone, including family and friends, scream or curse at you?: Patient unable to answer  Living Situation: Low Risk  (06/17/2024)   Living Situation    What is your living situation today?: I have a steady place to live    Think about the place you live. Do you have problems with any of the following? Choose all that apply:: None/None on this list  [6] Patient Active Problem List Diagnosis   Open wound of left foot   Wound, open   Impaired mobility and endurance   Opioid use disorder    Substance abuse (HCC)   Ulcer of left foot, with necrosis of muscle (HCC)   Cellulitis of foot, left   Cellulitis of left foot   Pyogenic inflammation of bone (HCC)   Closed displaced fracture of medial malleolus of right tibia   Cellulitis of arm, left

## 2024-06-19 NOTE — Discharge Summary (Signed)
 [facility] Medicine Discharge Summary   Demographics: Caleb Bush  44 y.o. Jan 04, 1981 MRN: [id number]    Extended Emergency Contact Information Primary Emergency Contact: Hall,Claudia Mobile Phone: [phone] Relation: Sister  Full Code  Admit Date: 06/17/2024                            Attending Physician: No att. providers found Discharge Date: 06/19/2024  Primary Care Provider: PCP Needed PPI   None  Consults during this admission: Consult Orders             IP CONSULT TO PSYCHIATRY       Specialty:  Psychiatry  Provider:  (Not yet assigned)      IP CONSULT TO ORTHOPEDIC SURGERY       Specialty:  Orthopedic Surgery  Provider:  (Not yet assigned)      IP CONSULT TO GENERAL SURGERY       Specialty:  General Surgery  Provider:  (Not yet assigned)      IP CONSULT TO HOSPITALIST       Provider:  (Not yet assigned)              Active & Resolved Diagnosis: Principal Problem:   Cellulitis of arm, left Active Problems:   Opioid use disorder Resolved Problems:   * No resolved hospital problems. *   Disposition: LEFT AMA   Hospital Course: HPI-43 y.o. male with history of IV drug use (heroin) and recurrent cellulitis of the arm presenting to [facility] ED for c/o left arm and hand swelling that began 06/15/2024. Patient states he was working with metal shavings the day before the swelling occurred. He denies injury to site , he denies recent IV drug use in that arm. Patient reports the pain is intense, currently on methadone  100mg ? Daily , but missed his appointment today due to coming to the ED. Patient is reporting pain in his fingers and decreased ROM. Unsure if up to date on tetanus vaccine.   Cellulitis of arm, left --reports starting 06/15/24 --blood cultures have been drawn which is negative  --kept on cefepime , Vancomycin  and flagyl . --seen by orthopedic, didn't think need surgery at time, continue antibiotics.  - wbc are trending down  Today it looks much  better, needed atleast another day of IV antibiotics, I saw him this morning, he agreed to stay but later int he day he left AMA with out talking to Provider. He was made aware the danger of not finishing antibiotics course which can cause sepsis which can lead to death. Sent prescription of oral antibiotics on discharge.  Opioid use disorder -polysubstance abuse - UDS is positive,  - Psyc consulted - resumed his methadone  but he left AMA      Predictive Model Details        9.8% (Low)  Factor Value   Calculated 06/19/2024 12:51 22% Braden score 17   Readmission Risk Score v2 Model 9% Number of active outpatient medication orders 1    6% Number of hospitalizations in last year 0    5% Latest RDW in last 72 hrs 13.7 %    4% Latest creatinine in last 72 hrs .44 mg/dL     Wound / Incision Assessment: Refer to Chart Review and Media Tab for images if available.      Temp:  [98.1 F (36.7 C)-99.2 F (37.3 C)] 99.2 F (37.3 C) Heart Rate:  [68-84] 68 Resp:  [16-19] 18 BP: (  146-166)/(83-97) 163/95      Discharge Medications     New Medications      Sig Disp Refill Start End  cephALEXin 500 mg capsule Commonly known as: KEFLEX  Take 1 capsule (500 mg total) by mouth 4 (four) times a day for 7 days.  28 capsule  0     doxycycline  100 mg tablet Commonly known as: VIBRA -TABS  Take 1 tablet (100 mg total) by mouth 2 (two) times a day for 7 days. Take with 8 oz water. Do not lie down for at least 30 minutes after.  14 tablet  0         Unreviewed Medications      Sig Disp Refill Start End  methadone  oral liquid CONCENTRATE 10 mg/mL solution Commonly known as: DOLOPHINE   Take 90 mg by mouth daily.   0             Lab Results  Component Value Date/Time   HGB 13.4 (L) 06/19/2024 04:29 AM   HCT 40.0 (L) 06/19/2024 04:29 AM   WBC 11.95 (H) 06/19/2024 04:29 AM   PLT 287 06/19/2024 04:29 AM   Lab Results  Component Value Date/Time   NA 139 06/19/2024 04:29  AM   K 3.3 (L) 06/19/2024 04:29 AM   CREATININE 0.44 (L) 06/19/2024 04:29 AM   BUN 10 06/19/2024 04:29 AM   GLUCOSE 102 (H) 06/19/2024 04:29 AM    Pertinent Imaging: CT Radius Ulna Left WO Contrast  Final Result by Deward Franky Conn, MD (09/18 0759)  CT FOREARM (RADIUS/ULNA) LEFT WITHOUT CONTRAST, 06/17/2024 6:56 AM    INDICATION: Soft tissue infection suspected, forearm, xray done   COMPARISON: Same-day left elbow radiographs.    TECHNIQUE: Thin-section images of the left forearm (radius/ulna) were   obtained without intravenous contrast. Supplemental 2D reformatted images   were generated and reviewed as needed.    All CT scans at [facility] and [facility]   Imaging are performed using radiation dose optimization techniques as   appropriate to a performed exam, including but not limited to one or more   of the following: automatic exposure control, adjustment of the mA and/or   kV according to patient size, use of iterative reconstruction technique.   In addition, our institution participates in a radiation dose monitoring   program to optimize patient radiation exposure.    FINDINGS:  . Circumferential subcuticular reticulation and edema with overlying skin   thickening throughout the entire visualized left upper extremity, most   pronounced along the ulnar aspect of the arm and forearm and along the   dorsal hand.   . There is a small superficial ulceration along the ulnar aspect of the   mid to distal forearm.  . No focal drainable fluid collection. No intramuscular collection.  . No evidence of acute osteomyelitis. No subcutaneous gas suggest   necrotizing fasciitis.   . No acute fracture.  . No malalignment.  . Joint spaces are maintained.    Additional:   Large colonic stool burden. Similar severe left hip degenerative changes   with superior dislocation of the left femur, grossly similar to prior.   Moderate right hip and pubic  symphyseal joint degenerative changes. Mild   aortoiliac atherosclerosis.    IMPRESSION:  Diffuse circumferential subcutaneous edema and skin thickening throughout   the visualized upper extremity. Correlate for evidence of cellulitis. No   focal drainable fluid collection. No evidence of acute osteomyelitis or  necrotizing fasciitis.      XR Elbow 2 Views Left  Final Result by Deward Franky Conn, MD [phone])  X-RAY ELBOW LEFT (2 VIEWS), 06/17/2024 6:16 AM    INDICATION: Injury.  COMPARISON: None.    IMPRESSION:  1.  No erosions to suggest acute osteomyelitis.   2.  Soft tissue swelling about the elbow, circumferentially involving the   visualized proximal forearm and distal arm. Correlate for evidence of   cellulitis.   3.  No subcutaneous gas to suggest necrotizing fasciitis.  4.  No acute fracture or malalignment.  5.  No joint effusion.  6.  Joint spaces are maintained.        Electronically signed by: Stanly Oris, MD 06/19/2024 1:51 PM   Time spent on discharge: 40 min

## 2024-09-08 ENCOUNTER — Encounter (HOSPITAL_BASED_OUTPATIENT_CLINIC_OR_DEPARTMENT_OTHER): Payer: Self-pay | Admitting: Emergency Medicine

## 2024-09-08 ENCOUNTER — Other Ambulatory Visit: Payer: Self-pay

## 2024-09-08 ENCOUNTER — Emergency Department (HOSPITAL_BASED_OUTPATIENT_CLINIC_OR_DEPARTMENT_OTHER)
Admission: EM | Admit: 2024-09-08 | Discharge: 2024-09-08 | Disposition: A | Discharge status: Home / Self Care | Payer: MEDICAID | Attending: Emergency Medicine | Admitting: Emergency Medicine

## 2024-09-08 ENCOUNTER — Emergency Department (HOSPITAL_BASED_OUTPATIENT_CLINIC_OR_DEPARTMENT_OTHER): Payer: MEDICAID

## 2024-09-08 DIAGNOSIS — W1839XA Other fall on same level, initial encounter: Secondary | ICD-10-CM | POA: Insufficient documentation

## 2024-09-08 DIAGNOSIS — S93401A Sprain of unspecified ligament of right ankle, initial encounter: Secondary | ICD-10-CM | POA: Diagnosis present

## 2024-09-08 LAB — COMPREHENSIVE METABOLIC PANEL WITH GFR
ALT: 47 U/L — ABNORMAL HIGH (ref 0–44)
AST: 46 U/L — ABNORMAL HIGH (ref 15–41)
Albumin: 3.6 g/dL (ref 3.5–5.0)
Alkaline Phosphatase: 123 U/L (ref 38–126)
Anion gap: 10 (ref 5–15)
BUN: 11 mg/dL (ref 6–20)
CO2: 26 mmol/L (ref 22–32)
Calcium: 9 mg/dL (ref 8.9–10.3)
Chloride: 101 mmol/L (ref 98–111)
Creatinine, Ser: 0.47 mg/dL — ABNORMAL LOW (ref 0.61–1.24)
GFR, Estimated: >60 mL/min (ref >60)
Glucose, Bld: 97 mg/dL (ref 70–99)
Potassium: 4 mmol/L (ref 3.5–5.1)
Sodium: 138 mmol/L (ref 135–145)
Total Bilirubin: 0.3 mg/dL (ref 0.0–1.2)
Total Protein: 7.1 g/dL (ref 6.5–8.1)

## 2024-09-08 LAB — SEDIMENTATION RATE: Sed Rate: 17 mm/h — ABNORMAL HIGH (ref 0–16)

## 2024-09-08 LAB — CBC WITH DIFFERENTIAL/PLATELET
Abs Immature Granulocytes: 0.02 K/uL (ref 0.00–0.07)
Basophils Absolute: 0.1 K/uL (ref 0.0–0.1)
Basophils Relative: 1 %
Eosinophils Absolute: 0.2 K/uL (ref 0.0–0.5)
Eosinophils Relative: 2 %
HCT: 36.2 % — ABNORMAL LOW (ref 39.0–52.0)
Hemoglobin: 11.7 g/dL — ABNORMAL LOW (ref 13.0–17.0)
Immature Granulocytes: 0 %
Lymphocytes Relative: 22 %
Lymphs Abs: 1.8 K/uL (ref 0.7–4.0)
MCH: 27.9 pg (ref 26.0–34.0)
MCHC: 32.3 g/dL (ref 30.0–36.0)
MCV: 86.2 fL (ref 80.0–100.0)
Monocytes Absolute: 0.9 K/uL (ref 0.1–1.0)
Monocytes Relative: 10 %
Neutro Abs: 5.4 K/uL (ref 1.7–7.7)
Neutrophils Relative %: 65 %
Platelets: 296 K/uL (ref 150–400)
RBC: 4.2 MIL/uL — ABNORMAL LOW (ref 4.22–5.81)
RDW: 13.2 % (ref 11.5–15.5)
WBC: 8.2 K/uL (ref 4.0–10.5)
nRBC: 0 % (ref 0.0–0.2)

## 2024-09-08 LAB — LACTIC ACID, PLASMA: Lactic Acid, Venous: 0.8 mmol/L (ref 0.5–1.9)

## 2024-09-08 LAB — C-REACTIVE PROTEIN: CRP: 10.6 mg/dL — ABNORMAL HIGH (ref <1.0)

## 2024-09-08 MED ORDER — KETOROLAC TROMETHAMINE 60 MG/2ML IM SOLN
60.0000 mg | Freq: Once | INTRAMUSCULAR | Status: AC
  Administered 2024-09-08: 60 mg via INTRAMUSCULAR
  Filled 2024-09-08: qty 2

## 2024-09-08 NOTE — ED Provider Notes (Signed)
 [place] [facility] Provider Note   CSN: [id number] Arrival date & time: 09/08/24  1408     Patient presents with: Leg Pain   Caleb Bush is a 43 y.o. male.   Patient here with right ankle pain after he fell several nights ago.  Prior injury to the right leg.  He says he has chronic swelling in the right ankle uses a cane at baseline but increased pain and swelling last couple days.  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  Denies any recent drug use.  He is on methadone .  He took methadone  today.  He denies any fevers or chills.  Does have history of hepatitis C as well.  He had initial surgery last July on this right ankle but sounds like he was supposed to have additional surgery and never really followed up with anybody.  He has been dealing with chronic pain in the right ankle since that time with intermittent swelling but worse since he fell and twisted it Sunday night.  He has been ambulatory with sneakers and his cane since however.  The history is provided by the patient.       Prior to Admission medications   Medication Sig Start Date End Date Taking? Authorizing Provider  amLODipine  (NORVASC ) 5 MG tablet Take 1 tablet (5 mg total) by mouth daily. Patient not taking: Reported on 04/16/2023 12/05/20   Randol Simmonds, MD  ibuprofen  (ADVIL ) 200 MG tablet Take 200-800 mg by mouth every 6 (six) hours as needed for mild pain or headache.    [provider]  naloxone  (NARCAN ) nasal spray 4 mg/0.1 mL Place 0.4 mg into the nose once as needed (for overdose- ADMINISTER IN CASE OF OPIOID EMERGENCY PLACE ONE SPRAY IN NOSTRIL. REPEAT ONE SPRAY IN OTHER NOSTRIL AFTER 3 MINUTES IF NO OR MINIMAL RESPONSE.). 03/03/23   [provider]  SUBOXONE  8-2 MG FILM Place 1 Film under the tongue 3 (three) times daily. 03/10/23   [provider]    Allergies: Clonidine  derivatives and Ivp dye [iodinated contrast media]    Review of Systems  Updated  Vital Signs BP (!) 135/95   Pulse 87   Temp 97.8 F (36.6 C)   Resp 14   Ht 5' 11 (1.803 m)   Wt 63.5 kg   SpO2 97%   BMI 19.53 kg/m   Physical Exam Vitals and nursing note reviewed.  Constitutional:      General: He is not in acute distress.    Appearance: He is well-developed. He is not ill-appearing.  HENT:     Head: Normocephalic and atraumatic.     Nose: Nose normal.     Mouth/Throat:     Mouth: Mucous membranes are moist.  Eyes:     Extraocular Movements: Extraocular movements intact.     Conjunctiva/sclera: Conjunctivae normal.     Pupils: Pupils are equal, round, and reactive to light.  Cardiovascular:     Rate and Rhythm: Normal rate and regular rhythm.     Pulses: Normal pulses.     Heart sounds: Normal heart sounds. No murmur heard. Pulmonary:     Effort: Pulmonary effort is normal. No respiratory distress.     Breath sounds: Normal breath sounds.  Abdominal:     Palpations: Abdomen is soft.     Tenderness: There is no abdominal tenderness.  Musculoskeletal:        General: Tenderness and deformity present.     Cervical back:  Normal range of motion and neck supple.     Comments: Patient with diffuse swelling of the right foot and ankle and lower tibial area with maybe a little bit of redness in the medial portion of the leg but no real diffuse redness or warmth, cannot really test range of motion secondary to limited range of motion at baseline  Skin:    General: Skin is warm and dry.     Capillary Refill: Capillary refill takes less than 2 seconds.  Neurological:     General: No focal deficit present.     Mental Status: He is alert and oriented to person, place, and time.     Sensory: No sensory deficit.     Motor: No weakness.     Coordination: Coordination normal.  Psychiatric:        Mood and Affect: Mood normal.     (all labs ordered are listed, but only abnormal results are displayed) Labs Reviewed  CBC WITH DIFFERENTIAL/PLATELET - Abnormal;  Notable for the following components:      Result Value   RBC 4.20 (*)    Hemoglobin 11.7 (*)    HCT 36.2 (*)    All other components within normal limits  COMPREHENSIVE METABOLIC PANEL WITH GFR - Abnormal; Notable for the following components:   Creatinine, Ser 0.47 (*)    AST 46 (*)    ALT 47 (*)    All other components within normal limits  SEDIMENTATION RATE - Abnormal; Notable for the following components:   Sed Rate 17 (*)    All other components within normal limits  CULTURE, BLOOD (ROUTINE X 2)  CULTURE, BLOOD (ROUTINE X 2)  LACTIC ACID, PLASMA  C-REACTIVE PROTEIN    EKG: None  Radiology: DG Tibia/Fibula Right Result Date: 09/08/2024 CLINICAL DATA:  Right lower extremity pain. EXAM: RIGHT ANKLE - COMPLETE 3+ VIEW; RIGHT TIBIA AND FIBULA - 2 VIEW COMPARISON:  Right ankle radiograph dated 01/24/2009. FINDINGS: Old healed fracture deformity of the distal fibula status post prior ORIF. There is lucency along the distal aspect of the fixation sideplate and screws suggestive of loosening. There is lateral angulation of the distal fibula and fixation hardware. Chronic appearing nondisplaced fracture of the medial malleolus. There is no acute fracture or dislocation. There is arthritic changes of the ankle mortise with joint space narrowing and subcortical cystic changes. There is overall eversion of the foot. There is diffuse subcutaneous edema and soft tissue swelling of the ankle. Findings may represent cellulitis. Septic arthritis at the ankle is not excluded clinical correlation is recommended. IMPRESSION: 1. No acute fracture or dislocation. 2. Old healed fracture deformity of the distal fibula status post prior ORIF. Lucency along the distal aspect of the fixation sideplate and screws suggestive of loosening. 3. Chronic appearing nondisplaced fracture of the medial malleolus. 4. Diffuse subcutaneous edema and soft tissue swelling of the ankle. Electronically Signed   By: Vanetta Chou M.D.   On: 09/08/2024 15:44   DG Ankle Complete Right Result Date: 09/08/2024 CLINICAL DATA:  Right lower extremity pain. EXAM: RIGHT ANKLE - COMPLETE 3+ VIEW; RIGHT TIBIA AND FIBULA - 2 VIEW COMPARISON:  Right ankle radiograph dated 01/24/2009. FINDINGS: Old healed fracture deformity of the distal fibula status post prior ORIF. There is lucency along the distal aspect of the fixation sideplate and screws suggestive of loosening. There is lateral angulation of the distal fibula and fixation hardware. Chronic appearing nondisplaced fracture of the medial malleolus. There is no acute fracture  or dislocation. There is arthritic changes of the ankle mortise with joint space narrowing and subcortical cystic changes. There is overall eversion of the foot. There is diffuse subcutaneous edema and soft tissue swelling of the ankle. Findings may represent cellulitis. Septic arthritis at the ankle is not excluded clinical correlation is recommended. IMPRESSION: 1. No acute fracture or dislocation. 2. Old healed fracture deformity of the distal fibula status post prior ORIF. Lucency along the distal aspect of the fixation sideplate and screws suggestive of loosening. 3. Chronic appearing nondisplaced fracture of the medial malleolus. 4. Diffuse subcutaneous edema and soft tissue swelling of the ankle. Electronically Signed   By: Vanetta Chou M.D.   On: 09/08/2024 15:44     Procedures   Medications Ordered in the ED  ketorolac  (TORADOL ) injection 60 mg (60 mg Intramuscular Given 09/08/24 1604)                                    Medical Decision Making Amount and/or Complexity of Data Reviewed Labs: ordered. Radiology: ordered.  Risk Prescription drug management.   Caleb Bush is here with right ankle pain after fall several days ago.  He is got chronic pain to his right ankle with chronic deformity.  Overall states that swelling out of the last several days.  But he has some chronic  swelling there.  Discussed a little bit of redness to the medial malleolus of the ankle but nothing really diffuse and overall has chronic edema type changes to the upper foot and distal tibia.  He denies any fevers or chills.  He does not have a fever here.  He denies any IV drug use but did have positive drug screen a few months ago in the [facility] system when he was admitted for cellulitis of his forearm.  Sounds like he never really followed up with orthopedic after he had surgery on this right ankle about a year ago.  Not sure if he left AMA and was post to have more surgery at that time somewhat unclear but he was not happy with the care there and never really seek orthopedic follow-up regarding his chronic deformity.  He uses a cane at baseline.  He is neurovascular neuromuscular intact on exam.  X-rays today show no acute fracture or dislocation does show some loosening of the screws and old deformity.  There is subcutaneous edema throughout the foot and ankle.  he has chronic appearing nondisplaced fracture of the medial malleolus.  Ultimately I have pretty low suspicion for septic arthritis given history and physical and exam but he is high risk for this.  He does have some redness and irritation to the medial malleolus on the right.  Cannot really test range of motion because he has chronic decreased range of motion.  Could be cellulitis but I do think this is an inflammatory process from trauma.  Will get lab work including blood cultures.   I talked with Dr. Beuford with orthopedics who is reviewed x-rays and images who agrees likely low suspicion for septic joint but ultimately recommended lab work as well and I will try to reach out to the orthopedic team in the [facility] system to get their recommendations given that he had surgery there last year.  He got incarcerated after that time and was unable to follow-up with anybody from that team.  Overall lab work showed no significant leukocytosis anemia  or  electrolyte abnormality.  Lactic acid was normal.  Repeat temperature was 97.8.  ESR 17.  CRP is a send out but overall clinically have low suspicion for septic joint given that he fell twisted his ankle a couple days ago.  When I looked further on charts it looks like he had original surgery in 2020 with podiatry team Elida Ernst at [facility].  However patient did reinjure this ankle last year at [facility] was put in a splint and never followed up with anyone.  Seems like that is when he injured hardware/had ongoing medial malleolus injury.  I talked with Dr. Caprice who is on-call for this podiatry team who I was able to send him photos of the x-rays and of the patient's clinical exam.  I sent in photos of his ankle.  We reviewed his lab work together on the phone.  Overall all in agreement that this is unlikely infectious process at this time and likely traumatic process.  Will put him in an Ace wrap have him be nonweightbearing and try to follow-up with the podiatry team with Lorelee Ernst and we will also give him the information to follow-up with his orthopedic team Dr. Fairy Livings.  Ultimately patient understand strict return precautions.  He understands to return if he develops fever greater than 100.4, worsening redness swelling warmth that involves the full ankle and the foot and above the ankle.  He understands to follow-up with podiatry and orthopedic team and I gave him the numbers for both.  Overall he understands return precautions.  Discharged in good condition.   This chart was dictated using voice recognition software.  Despite best efforts to proofread,  errors can occur which can change the documentation meaning.      Final diagnoses:  Sprain of right ankle, unspecified ligament, initial encounter    ED Discharge Orders     None          Ruthe Cornet, DO 09/08/24 1843

## 2024-09-08 NOTE — Discharge Instructions (Signed)
 Overall I do think you reinjured your ankle when you fell the other day and resprained it/created inflammation.  Recommend Ace wrap nonweightbearing/light weightbearing with crutches.  Follow-up with Dr. Riley/Dr. Veatrice numbers have been provided.  Please return if you develop fever greater than 100.4 worsening redness and swelling as we discussed.  Recommend ice as well.

## 2024-09-08 NOTE — ED Triage Notes (Signed)
 Pt c/o pain to lower RT leg s/p fall Sunday night; sts he has hardware there; swelling and redness noted

## 2024-09-13 LAB — CULTURE, BLOOD (ROUTINE X 2)
Culture: NO GROWTH
Special Requests: ADEQUATE
